# Patient Record
Sex: Female | Born: 1990 | Race: White | Hispanic: No | Marital: Single | Smoking: Never smoker
Health system: Southern US, Community
[De-identification: ages and names within clinical notes are randomized; demographics above are authoritative.]

## PROBLEM LIST (undated history)

## (undated) DIAGNOSIS — Z789 Other specified health status: Secondary | ICD-10-CM

## (undated) DIAGNOSIS — D649 Anemia, unspecified: Secondary | ICD-10-CM

## (undated) HISTORY — PX: MYRINGOTOMY: SUR874

---
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
## 2010-01-28 ENCOUNTER — Emergency Department: Payer: Self-pay | Admitting: Emergency Medicine

## 2012-12-10 ENCOUNTER — Emergency Department: Payer: Self-pay | Admitting: Emergency Medicine

## 2013-05-30 ENCOUNTER — Emergency Department: Payer: Self-pay | Admitting: Emergency Medicine

## 2013-10-18 ENCOUNTER — Emergency Department: Payer: Self-pay | Admitting: Emergency Medicine

## 2014-02-26 ENCOUNTER — Emergency Department: Payer: Self-pay | Admitting: Emergency Medicine

## 2014-04-03 LAB — OB RESULTS CONSOLE VARICELLA ZOSTER ANTIBODY, IGG: Varicella: IMMUNE

## 2014-04-03 LAB — OB RESULTS CONSOLE ABO/RH: RH Type: POSITIVE

## 2014-04-03 LAB — OB RESULTS CONSOLE GC/CHLAMYDIA
Chlamydia: NEGATIVE
Gonorrhea: NEGATIVE

## 2014-04-03 LAB — OB RESULTS CONSOLE ANTIBODY SCREEN: Antibody Screen: NEGATIVE

## 2014-04-03 LAB — OB RESULTS CONSOLE HEPATITIS B SURFACE ANTIGEN: HEP B S AG: NEGATIVE

## 2014-04-03 LAB — OB RESULTS CONSOLE RUBELLA ANTIBODY, IGM: Rubella: IMMUNE

## 2014-04-03 LAB — OB RESULTS CONSOLE RPR: RPR: NONREACTIVE

## 2014-04-03 LAB — OB RESULTS CONSOLE HIV ANTIBODY (ROUTINE TESTING): HIV: NONREACTIVE

## 2014-04-09 ENCOUNTER — Encounter: Payer: Self-pay | Admitting: Obstetrics & Gynecology

## 2014-05-21 ENCOUNTER — Encounter: Payer: Self-pay | Admitting: Obstetrics and Gynecology

## 2014-05-22 NOTE — L&D Delivery Note (Signed)
Delivery Note At 8:22 AM a viable female was delivered via C-Section, Low Transverse (Presentation: cephalic ).  APGAR: 9, 10 ; weight 7 lb 9 oz .   Placenta status: , .  Cord: 3 vessels with the following complications: None.    Anesthesia: Spinal  Episiotomy:   Lacerations:   Suture Repair: n/a Est. Blood Loss (mL):  900 mL  Mom to postpartum.  Baby to Couplet care / Skin to Skin.  Conard NovakStephen D. Jackson, MD, FACOG 10/15/2014 9:31 AM

## 2014-07-30 ENCOUNTER — Encounter: Payer: Self-pay | Admitting: Obstetrics & Gynecology

## 2014-08-30 ENCOUNTER — Observation Stay
Admit: 2014-08-30 | Disposition: A | Discharge status: Home / Self Care | Payer: Self-pay | Attending: Obstetrics and Gynecology | Admitting: Obstetrics and Gynecology

## 2014-08-30 LAB — URINALYSIS, COMPLETE
BLOOD: NEGATIVE
Bilirubin,UR: NEGATIVE
Glucose,UR: NEGATIVE mg/dL (ref 0–75)
KETONE: NEGATIVE
Nitrite: NEGATIVE
PROTEIN: NEGATIVE
Ph: 7 (ref 4.5–8.0)
SPECIFIC GRAVITY: 1.013 (ref 1.003–1.030)

## 2014-09-02 ENCOUNTER — Observation Stay
Admit: 2014-09-02 | Disposition: A | Discharge status: Home / Self Care | Payer: Self-pay | Attending: Certified Nurse Midwife | Admitting: Certified Nurse Midwife

## 2014-09-10 ENCOUNTER — Encounter
Admit: 2014-09-10 | Disposition: A | Discharge status: Home / Self Care | Payer: Self-pay | Attending: Maternal & Fetal Medicine | Admitting: Maternal & Fetal Medicine

## 2014-09-23 ENCOUNTER — Observation Stay
Admission: RE | Admit: 2014-09-23 | Discharge: 2014-09-23 | Disposition: A | Discharge status: Home / Self Care | Payer: Medicaid Other | Attending: Obstetrics and Gynecology | Admitting: Obstetrics and Gynecology

## 2014-09-23 DIAGNOSIS — Z3490 Encounter for supervision of normal pregnancy, unspecified, unspecified trimester: Secondary | ICD-10-CM | POA: Diagnosis not present

## 2014-09-23 DIAGNOSIS — Z3689 Encounter for other specified antenatal screening: Secondary | ICD-10-CM

## 2014-09-23 HISTORY — DX: Other specified health status: Z78.9

## 2014-09-27 ENCOUNTER — Telehealth: Payer: Self-pay

## 2014-09-27 ENCOUNTER — Telehealth: Payer: Self-pay | Admitting: *Deleted

## 2014-09-27 NOTE — Telephone Encounter (Signed)
Labor precautions given, patient advised to call OB on call with emergent concerns or visit emergency department. Patient denies leaking of fluid, vaginal bleeding and/or contractions and pain.

## 2014-09-27 NOTE — Telephone Encounter (Signed)
Spoke to patient's mother on phone.  She stated her daughter was concerned because she had been having Braxton Hicks contractions for 5 days and she is at work today at [employer].  States that she was leaking mucus stuff today and this concerned her.  Discussed with patient's mother that this could be her mucus plug, but that the patient can place a pad on and continue to assess for fluid leakage.  Recommended that if patient able to she should sit down for a little bit at work and drink a lot of water.  To continue to assess ctx pattern.  Discussed that if patient is concerned she can come into hospital for further assessment or if she notices any change from current status.

## 2014-09-29 NOTE — H&P (Signed)
L&D Evaluation:  History:  HPI 24 yo G2P1001 with LMP of 01/14/14  & EDD of 10/21/14 with pNC at [facility] here tonight for c/o "rt hip pain and lt lower lumbar pain", increased freq of urine. After eval, pt had [facility] on her chart but, had seen [facility] and scheduled for LTCS/BTL. Pt is uninformed of who her dr is. No ROM, VB, decreased FM or UC's. No fever, no aching, no chills, no other complaints. After eval, Dr Staebler was advised and agreed with plan of care.   Presents with back pain, Rt hip pain   Patient's Medical History Anemia, Depression, Obesity,   Patient's Surgical History C-section (failed vacuum at -1 station)   Medications Pre Natal Vitamins   Allergies Sulfa   Social History none   Family History Non-Contributory   ROS:  ROS All systems were reviewed.  HEENT, CNS, GI, GU, Respiratory, CV, Renal and Musculoskeletal systems were found to be normal.   Exam:  Vital Signs stable   General no apparent distress   Mental Status clear   Chest clear   Heart normal sinus rhythm, no murmur/gallop/rubs   Abdomen gravid, non-tender   Estimated Fetal Weight Average for gestational age   Back no CVAT   Edema 1+   Reflexes 1+   Clonus negative   Pelvic int os closed/ext os 1 cm   Mebranes Intact   FHT normal rate with no decels, +accels, NST reactive   Ucx absent   Skin dry   Lymph no lymphadenopathy   Other Pt enc to fu with [facility] as she is a [facility] pt and not a [facility] pt. Dr Staebler aware and met pt. Agreed with plan of care   Impression:  Impression IUP at 32 4/7 weeks with Rt hip pain and Lt lumbar pain   Plan:  Plan discharge, Amoxicillin 250 mg po tid, needs liq x 10 days   Comments Rt hip: no trauma, no ecchymosis, no edema. Lt lumbar back: Neg CVAT. no spasm   Electronic Signatures: Jones, Caron W (CNM)  (Signed 10-Apr-16 22:32)  Authored: L&D Evaluation   Last Updated: 10-Apr-16 22:32 by Jones, Caron W (CNM) 

## 2014-09-30 ENCOUNTER — Observation Stay
Admission: RE | Admit: 2014-09-30 | Discharge: 2014-09-30 | Disposition: A | Discharge status: Home / Self Care | Payer: Medicaid Other | Attending: Certified Nurse Midwife | Admitting: Certified Nurse Midwife

## 2014-09-30 DIAGNOSIS — Z3A37 37 weeks gestation of pregnancy: Secondary | ICD-10-CM | POA: Insufficient documentation

## 2014-09-30 DIAGNOSIS — E669 Obesity, unspecified: Secondary | ICD-10-CM | POA: Insufficient documentation

## 2014-09-30 DIAGNOSIS — Z3490 Encounter for supervision of normal pregnancy, unspecified, unspecified trimester: Secondary | ICD-10-CM

## 2014-09-30 DIAGNOSIS — O26893 Other specified pregnancy related conditions, third trimester: Secondary | ICD-10-CM | POA: Diagnosis not present

## 2014-09-30 NOTE — Discharge Instructions (Signed)
Keep scheduled appointment on 10/01/14

## 2014-09-30 NOTE — Progress Notes (Addendum)
24 year old G2 P1001 with EDC=10/21/2014 by 12 4/7 week ultrasound presents to L&D for a NST/AFI at [redacted] weeks gestation. Antenatal testing being done weekly due to obesity with BMI>42. PNC at [facility] also remarkable for prior Cesarean section and she has a CS scheduled in 2 weeks. Reports good FM. Growth scans have been "normal" per patient.   O: BP 122/65 mmHg  Pulse 92  Temp(Src) 98.7 F (37.1 C) (Oral)  Resp 16  FHR 135-140 with accels to 150s to 160 with moderate variability AFI; 12.8 cm. Cephalic Toco: no contractions seen  A: IUP at 37 weeks with reactive NST and normal AFI  P: DC home RTN in 1 week for NST/AFI FU at [facility] as scheduled. Daily FKCs  Phelicia Dantes, CNM

## 2014-09-30 NOTE — OB Triage Note (Signed)
Scheduled NST, completed

## 2014-10-07 ENCOUNTER — Encounter: Payer: Self-pay | Admitting: *Deleted

## 2014-10-07 ENCOUNTER — Observation Stay
Admission: RE | Admit: 2014-10-07 | Discharge: 2014-10-07 | Disposition: A | Discharge status: Home / Self Care | Payer: Medicaid Other | Source: Ambulatory Visit | Attending: Obstetrics & Gynecology | Admitting: Obstetrics & Gynecology

## 2014-10-07 DIAGNOSIS — Z3A38 38 weeks gestation of pregnancy: Secondary | ICD-10-CM | POA: Insufficient documentation

## 2014-10-07 DIAGNOSIS — O99213 Obesity complicating pregnancy, third trimester: Principal | ICD-10-CM | POA: Diagnosis present

## 2014-10-07 NOTE — OB Triage Note (Signed)
Scheduled NST for obesity.

## 2014-10-07 NOTE — Discharge Summary (Signed)
Patient discharged home, discharge instructions reviewed, pt states understanding. Pt has appointment at [facility] today and US tomorrow with C/S scheduled next week, pt left floor in stable condition and denies any other needs at this time

## 2014-10-07 NOTE — Progress Notes (Signed)
Pt is a 24 yo G2P1 at 3133w0d pt of the [facility] seen today for an NST for obesity. NST found to be reactive.  FHR 125, moderate variability, +accels, no decels.   Will continue with current plan for APT. C/S scheduled 10/15/14.

## 2014-10-07 NOTE — Discharge Instructions (Signed)
Drink plenty of fluid and get plenty of rest, call your provider for any other concerns and keep all scheduled appointments  Office visit today and US tomorrow. C/S scheduled next week

## 2014-10-14 ENCOUNTER — Encounter: Payer: Self-pay | Admitting: Obstetrics and Gynecology

## 2014-10-14 ENCOUNTER — Encounter
Admission: RE | Admit: 2014-10-14 | Discharge: 2014-10-14 | Disposition: A | Discharge status: Home / Self Care | Payer: Medicaid Other | Source: Ambulatory Visit | Attending: Obstetrics and Gynecology | Admitting: Obstetrics and Gynecology

## 2014-10-14 HISTORY — DX: Anemia, unspecified: D64.9

## 2014-10-14 LAB — CBC
HCT: 33.2 % — ABNORMAL LOW (ref 35.0–47.0)
HEMOGLOBIN: 10.6 g/dL — AB (ref 12.0–16.0)
MCH: 27.4 pg (ref 26.0–34.0)
MCHC: 32 g/dL (ref 32.0–36.0)
MCV: 85.6 fL (ref 80.0–100.0)
Platelets: 281 10*3/uL (ref 150–440)
RBC: 3.88 MIL/uL (ref 3.80–5.20)
RDW: 14.8 % — ABNORMAL HIGH (ref 11.5–14.5)
WBC: 14.3 10*3/uL — AB (ref 3.6–11.0)

## 2014-10-14 LAB — TYPE AND SCREEN
ABO/RH(D): O POS
Antibody Screen: NEGATIVE

## 2014-10-14 LAB — ABO/RH: ABO/RH(D): O POS

## 2014-10-15 ENCOUNTER — Encounter
Admission: RE | Disposition: A | Discharge status: Home / Self Care | Payer: Self-pay | Source: Ambulatory Visit | Attending: Obstetrics and Gynecology

## 2014-10-15 ENCOUNTER — Inpatient Hospital Stay: Payer: Medicaid Other | Admitting: Anesthesiology

## 2014-10-15 ENCOUNTER — Inpatient Hospital Stay
Admission: RE | Admit: 2014-10-15 | Payer: Medicaid Other | Source: Ambulatory Visit | Admitting: Obstetrics and Gynecology

## 2014-10-15 ENCOUNTER — Inpatient Hospital Stay
Admission: RE | Admit: 2014-10-15 | Discharge: 2014-10-17 | DRG: 765 | Disposition: A | Discharge status: Home / Self Care | Payer: Medicaid Other | Source: Ambulatory Visit | Attending: Obstetrics and Gynecology | Admitting: Obstetrics and Gynecology

## 2014-10-15 DIAGNOSIS — N736 Female pelvic peritoneal adhesions (postinfective): Secondary | ICD-10-CM | POA: Diagnosis present

## 2014-10-15 DIAGNOSIS — O34219 Maternal care for unspecified type scar from previous cesarean delivery: Secondary | ICD-10-CM

## 2014-10-15 DIAGNOSIS — Z302 Encounter for sterilization: Secondary | ICD-10-CM

## 2014-10-15 DIAGNOSIS — E669 Obesity, unspecified: Secondary | ICD-10-CM | POA: Diagnosis present

## 2014-10-15 DIAGNOSIS — Z3A39 39 weeks gestation of pregnancy: Secondary | ICD-10-CM | POA: Diagnosis present

## 2014-10-15 DIAGNOSIS — O99213 Obesity complicating pregnancy, third trimester: Secondary | ICD-10-CM | POA: Diagnosis present

## 2014-10-15 DIAGNOSIS — Z3490 Encounter for supervision of normal pregnancy, unspecified, unspecified trimester: Secondary | ICD-10-CM

## 2014-10-15 DIAGNOSIS — O3421 Maternal care for scar from previous cesarean delivery: Secondary | ICD-10-CM | POA: Diagnosis present

## 2014-10-15 DIAGNOSIS — Z882 Allergy status to sulfonamides status: Secondary | ICD-10-CM

## 2014-10-15 DIAGNOSIS — Z6841 Body Mass Index (BMI) 40.0 and over, adult: Secondary | ICD-10-CM

## 2014-10-15 DIAGNOSIS — Z3483 Encounter for supervision of other normal pregnancy, third trimester: Secondary | ICD-10-CM | POA: Diagnosis present

## 2014-10-15 DIAGNOSIS — O9989 Other specified diseases and conditions complicating pregnancy, childbirth and the puerperium: Secondary | ICD-10-CM | POA: Diagnosis present

## 2014-10-15 DIAGNOSIS — O99214 Obesity complicating childbirth: Secondary | ICD-10-CM | POA: Diagnosis present

## 2014-10-15 DIAGNOSIS — Z98891 History of uterine scar from previous surgery: Secondary | ICD-10-CM

## 2014-10-15 LAB — HIV ANTIBODY (ROUTINE TESTING W REFLEX): HIV SCREEN 4TH GENERATION: NONREACTIVE

## 2014-10-15 LAB — RPR: RPR Ser Ql: NONREACTIVE

## 2014-10-15 SURGERY — Surgical Case
Anesthesia: Spinal | Wound class: Clean Contaminated

## 2014-10-15 MED ORDER — BUPIVACAINE HCL 0.5 % IJ SOLN
5.0000 mL | Freq: Once | INTRAMUSCULAR | Status: DC
  Filled 2014-10-15: qty 5

## 2014-10-15 MED ORDER — BUPIVACAINE 0.25 % ON-Q PUMP DUAL CATH 400 ML: 400.0000 mL | INJECTION | Status: DC

## 2014-10-15 MED ORDER — HYDROMORPHONE HCL 1 MG/ML IJ SOLN: 0.2500 mg | INTRAMUSCULAR | Status: DC | PRN

## 2014-10-15 MED ORDER — CITRIC ACID-SODIUM CITRATE 334-500 MG/5ML PO SOLN
ORAL | Status: AC
  Administered 2014-10-15: 30 mL via ORAL
  Filled 2014-10-15: qty 15

## 2014-10-15 MED ORDER — IBUPROFEN 600 MG PO TABS
600.0000 mg | ORAL_TABLET | Freq: Four times a day (QID) | ORAL | Status: DC | PRN
  Filled 2014-10-15 (×2): qty 1

## 2014-10-15 MED ORDER — LACTATED RINGERS IV SOLN: Freq: Once | INTRAVENOUS | Status: DC

## 2014-10-15 MED ORDER — SODIUM CHLORIDE 0.9 % IJ SOLN: 3.0000 mL | INTRAMUSCULAR | Status: DC | PRN

## 2014-10-15 MED ORDER — CEFAZOLIN SODIUM-DEXTROSE 2-3 GM-% IV SOLR
INTRAVENOUS | Status: AC
  Administered 2014-10-15: 2 g via INTRAVENOUS
  Filled 2014-10-15: qty 50

## 2014-10-15 MED ORDER — BUPIVACAINE HCL (PF) 0.5 % IJ SOLN
INTRAMUSCULAR | Status: AC
  Filled 2014-10-15: qty 30

## 2014-10-15 MED ORDER — BUPIVACAINE IN DEXTROSE 0.75-8.25 % IT SOLN
INTRATHECAL | Status: DC | PRN
  Administered 2014-10-15: 1.5 mL via INTRATHECAL

## 2014-10-15 MED ORDER — NALBUPHINE HCL 10 MG/ML IJ SOLN: 5.0000 mg | INTRAMUSCULAR | Status: DC | PRN

## 2014-10-15 MED ORDER — DIBUCAINE 1 % RE OINT: 1.0000 "application " | TOPICAL_OINTMENT | RECTAL | Status: DC | PRN

## 2014-10-15 MED ORDER — DIPHENHYDRAMINE HCL 25 MG PO CAPS: 25.0000 mg | ORAL_CAPSULE | ORAL | Status: DC | PRN

## 2014-10-15 MED ORDER — WITCH HAZEL-GLYCERIN EX PADS: 1.0000 "application " | MEDICATED_PAD | CUTANEOUS | Status: DC | PRN

## 2014-10-15 MED ORDER — SCOPOLAMINE 1 MG/3DAYS TD PT72
1.0000 | MEDICATED_PATCH | Freq: Once | TRANSDERMAL | Status: DC
Start: 2014-10-15 — End: 2014-10-17
  Filled 2014-10-15: qty 1

## 2014-10-15 MED ORDER — SIMETHICONE 80 MG PO CHEW
80.0000 mg | CHEWABLE_TABLET | Freq: Three times a day (TID) | ORAL | Status: DC
  Administered 2014-10-15 – 2014-10-17 (×4): 80 mg via ORAL
  Filled 2014-10-15 (×5): qty 1

## 2014-10-15 MED ORDER — MENTHOL 3 MG MT LOZG
1.0000 | LOZENGE | OROMUCOSAL | Status: DC | PRN
  Filled 2014-10-15: qty 9

## 2014-10-15 MED ORDER — SENNOSIDES-DOCUSATE SODIUM 8.6-50 MG PO TABS: 2.0000 | ORAL_TABLET | ORAL | Status: DC

## 2014-10-15 MED ORDER — MORPHINE SULFATE (PF) 0.5 MG/ML IJ SOLN
INTRAMUSCULAR | Status: DC | PRN
  Administered 2014-10-15: .2 mg via INTRATHECAL

## 2014-10-15 MED ORDER — NALBUPHINE HCL 10 MG/ML IJ SOLN: 5.0000 mg | Freq: Once | INTRAMUSCULAR | Status: AC | PRN

## 2014-10-15 MED ORDER — CEFAZOLIN SODIUM-DEXTROSE 2-3 GM-% IV SOLR
2.0000 g | INTRAVENOUS | Status: AC
  Administered 2014-10-15: 2 g via INTRAVENOUS

## 2014-10-15 MED ORDER — DEXAMETHASONE SODIUM PHOSPHATE 10 MG/ML IJ SOLN
INTRAMUSCULAR | Status: DC | PRN
  Administered 2014-10-15: 10 mg via INTRAVENOUS

## 2014-10-15 MED ORDER — LANOLIN HYDROUS EX OINT: 1.0000 "application " | TOPICAL_OINTMENT | CUTANEOUS | Status: DC | PRN

## 2014-10-15 MED ORDER — LACTATED RINGERS IV SOLN
INTRAVENOUS | Status: DC
Start: 2014-10-15 — End: 2014-10-15
  Administered 2014-10-15 (×2): via INTRAVENOUS

## 2014-10-15 MED ORDER — OXYTOCIN 40 UNITS IN LACTATED RINGERS INFUSION - SIMPLE MED
INTRAVENOUS | Status: AC
  Administered 2014-10-15: 40 mL via INTRAVENOUS
  Filled 2014-10-15: qty 1000

## 2014-10-15 MED ORDER — DIPHENHYDRAMINE HCL 50 MG/ML IJ SOLN: 12.5000 mg | INTRAMUSCULAR | Status: DC | PRN

## 2014-10-15 MED ORDER — FERROUS SULFATE 325 (65 FE) MG PO TABS
325.0000 mg | ORAL_TABLET | Freq: Two times a day (BID) | ORAL | Status: DC
  Administered 2014-10-15: 325 mg via ORAL
  Filled 2014-10-15 (×2): qty 1

## 2014-10-15 MED ORDER — BUPIVACAINE HCL 0.5 % IJ SOLN
INTRAMUSCULAR | Status: DC | PRN
  Administered 2014-10-15: 10 mL

## 2014-10-15 MED ORDER — DIPHENHYDRAMINE HCL 25 MG PO CAPS: 25.0000 mg | ORAL_CAPSULE | Freq: Four times a day (QID) | ORAL | Status: DC | PRN

## 2014-10-15 MED ORDER — OXYTOCIN 40 UNITS IN LACTATED RINGERS INFUSION - SIMPLE MED
62.5000 mL/h | INTRAVENOUS | Status: AC
  Administered 2014-10-15: 40 mL via INTRAVENOUS
  Filled 2014-10-15 (×2): qty 1000

## 2014-10-15 MED ORDER — NALOXONE HCL 0.4 MG/ML IJ SOLN: 0.4000 mg | INTRAMUSCULAR | Status: DC | PRN

## 2014-10-15 MED ORDER — CITRIC ACID-SODIUM CITRATE 334-500 MG/5ML PO SOLN
30.0000 mL | Freq: Once | ORAL | Status: AC
  Administered 2014-10-15: 30 mL via ORAL

## 2014-10-15 MED ORDER — LACTATED RINGERS IV SOLN: INTRAVENOUS | Status: DC

## 2014-10-15 MED ORDER — BUPIVACAINE ON-Q PAIN PUMP (FOR ORDER SET NO CHG)
INJECTION | Status: DC
  Filled 2014-10-15: qty 1

## 2014-10-15 MED ORDER — MEPERIDINE HCL 25 MG/ML IJ SOLN: 6.2500 mg | INTRAMUSCULAR | Status: DC | PRN

## 2014-10-15 MED ORDER — DEXTROSE 5 % IV SOLN
1.0000 ug/kg/h | INTRAVENOUS | Status: DC | PRN
  Filled 2014-10-15: qty 2

## 2014-10-15 MED ORDER — BUPIVACAINE 0.25 % ON-Q PUMP DUAL CATH 400 ML
INJECTION | Status: AC
  Filled 2014-10-15: qty 400

## 2014-10-15 MED ORDER — PRENATAL MULTIVITAMIN CH
1.0000 | ORAL_TABLET | Freq: Every day | ORAL | Status: DC
  Filled 2014-10-15 (×2): qty 1

## 2014-10-15 MED ORDER — ONDANSETRON HCL 4 MG/2ML IJ SOLN: 4.0000 mg | Freq: Three times a day (TID) | INTRAMUSCULAR | Status: DC | PRN

## 2014-10-15 MED ORDER — ONDANSETRON HCL 4 MG/2ML IJ SOLN
INTRAMUSCULAR | Status: DC | PRN
  Administered 2014-10-15: 4 mg via INTRAVENOUS

## 2014-10-15 SURGICAL SUPPLY — 32 items
CANISTER SUCT 3000ML (MISCELLANEOUS) ×3 IMPLANT
CATH KIT ON-Q SILVERSOAK 5IN (CATHETERS) ×6 IMPLANT
CLOSURE WOUND 1/2 X4 (GAUZE/BANDAGES/DRESSINGS) ×1
DRSG TEGADERM 4X4.75 (GAUZE/BANDAGES/DRESSINGS) ×3 IMPLANT
DRSG TELFA 3X8 NADH (GAUZE/BANDAGES/DRESSINGS) ×3 IMPLANT
ELECT CAUTERY BLADE 6.4 (BLADE) ×3 IMPLANT
GAUZE SPONGE 4X4 12PLY STRL (GAUZE/BANDAGES/DRESSINGS) ×3 IMPLANT
GLOVE BIO SURGEON STRL SZ7 (GLOVE) ×3 IMPLANT
GLOVE INDICATOR 7.5 STRL GRN (GLOVE) ×3 IMPLANT
GOWN STRL REUS W/ TWL LRG LVL3 (GOWN DISPOSABLE) ×3 IMPLANT
GOWN STRL REUS W/TWL LRG LVL3 (GOWN DISPOSABLE) ×6
LIQUID BAND (GAUZE/BANDAGES/DRESSINGS) ×6 IMPLANT
NS IRRIG 1000ML POUR BTL (IV SOLUTION) ×3 IMPLANT
PACK C SECTION AR (MISCELLANEOUS) ×3 IMPLANT
PAD GROUND ADULT SPLIT (MISCELLANEOUS) ×3 IMPLANT
PAD OB MATERNITY 4.3X12.25 (PERSONAL CARE ITEMS) ×6 IMPLANT
PAD PREP 24X41 OB/GYN DISP (PERSONAL CARE ITEMS) ×3 IMPLANT
SPONGE LAP 18X18 5 PK (GAUZE/BANDAGES/DRESSINGS) ×6 IMPLANT
STRIP CLOSURE SKIN 1/2X4 (GAUZE/BANDAGES/DRESSINGS) ×2 IMPLANT
SUT CHROMIC GUT BROWN 0 54 (SUTURE) ×1 IMPLANT
SUT CHROMIC GUT BROWN 0 54IN (SUTURE) ×3
SUT MNCRL 4-0 (SUTURE) ×2
SUT MNCRL 4-0 27XMFL (SUTURE) ×1
SUT PDS AB 1 TP1 96 (SUTURE) ×3 IMPLANT
SUT PLAIN 2 0 XLH (SUTURE) ×3 IMPLANT
SUT PLAIN GUT 0 (SUTURE) ×6 IMPLANT
SUT PLAIN GUT 2-0 30 C14 SG823 (SUTURE) ×3
SUT VIC AB 0 CT1 36 (SUTURE) ×12 IMPLANT
SUT VICRYL 3-0 CR8 SH (SUTURE) ×3 IMPLANT
SUTURE MNCRL 4-0 27XMF (SUTURE) ×1 IMPLANT
SUTURE PLN GUT2-0 30 C14 SG823 (SUTURE) ×1 IMPLANT
SWABSTK COMLB BENZOIN TINCTURE (MISCELLANEOUS) ×3 IMPLANT

## 2014-10-15 NOTE — Transfer of Care (Signed)
Immediate Anesthesia Transfer of Care Note  Patient: Carolyn Davis  Procedure(s) Performed: Procedure(s): CESAREAN SECTION, Bilateral Tubal Ligation (N/A)  Patient Location: PACU and Women's Unit  Anesthesia Type:Spinal  Level of Consciousness: awake, alert  and oriented  Airway & Oxygen Therapy: Patient Spontanous Breathing and Patient connected to face mask oxygen  Post-op Assessment: Report given to RN and Post -op Vital signs reviewed and stable  Post vital signs: Reviewed and stable  Last Vitals:  Filed Vitals:   10/15/14 0540  Temp: 36.6 C  Resp: 22    Complications: No apparent anesthesia complications

## 2014-10-15 NOTE — Progress Notes (Signed)
Pt refuses to take pills.

## 2014-10-15 NOTE — H&P (Signed)
History and Physical Interval Note:  Carolyn KinsJennifer D Davis  has presented today for surgery, with the diagnosis of pregnancy  The various methods of treatment have been discussed with the patient and family. After consideration of risks, benefits and other options for treatment, the patient has consented to  Procedure(s): CESAREAN SECTION (N/A) and bilateral tubal ligation as a surgical intervention .  The patient's history has been reviewed, patient examined, no change in status, stable for surgery.  I have reviewed the patient's chart and labs.  Questions were answered to the patient's satisfaction.  I have discussed in great detail the risks/benefits/alternatives to permanent sterilization.  She voices understanding and prior consideration of these and still strongly desires a permanent form of sterilization.  The patient is not taking a beta blocker and none is indicated for this procedure.  Conard NovakStephen D. Jackson, MD, [facility] 10/15/2014 7:36 AM

## 2014-10-15 NOTE — Lactation Note (Signed)
This note was copied from the chart of Carolyn Davis. Lactation Consultation Note  Patient Name: Carolyn Davis [id number]'s Date: 10/15/2014 Reason for consult: Follow-up assessment   Maternal Data Does the patient have breastfeeding experience prior to this delivery?: Yes  Feeding Feeding Type: Breast Fed  LATCH Score/Interventions Latch: Grasps breast easily, tongue down, lips flanged, rhythmical sucking.  Audible Swallowing: Spontaneous and intermittent  Type of Nipple: Everted at rest and after stimulation  Comfort (Breast/Nipple): Soft / non-tender     Hold (Positioning): No assistance needed to correctly position infant at breast.  LATCH Score: 5510  Mother is not that enthusiastic about breast feeding and may need additional support.    Consult Status Consult Status: Follow-up    Trudee GripCarolyn P Garland Smouse 10/15/2014, 3:36 PM

## 2014-10-15 NOTE — Op Note (Signed)
Cesarean Section Procedure Note   Patient:Carolyn Davis  MRN: [id number]   Date of surgery: 10/15/2014   Pre-operative Diagnosis: 1) Intrauterine Pregnancy at [redacted]w[redacted]d, 2) History of prior cesarean delivery, desires repeat, 3) desires permanent sterilization  Post-operative Diagnosis: 1) Intrauterine Pregnancy at [redacted]w[redacted]d, 2) History of prior cesarean delivery, desires repeat, 3) desires permanent sterilization  Procedures:  1) Repeat low transverse cesarean section via pfannenstiel incsion 2) lysis of adhesions 3) bilateral tubal ligation via Pomeroy method  Surgeon: Surgeon(s) and Role:    * Conard Novak, MD - Primary    * Holland Bing, MD - Assisting   Anesthesia: spinal   Findings:  1) normal appearing gravid uterus, fallopian tubes, and ovaries 2) viable female infant with APGARS of 9 at 1 minute and 10 at 5 minutes 3) Dense adhesions of anterior abdominal wall to anterior uterus   Estimated Blood Loss: 900 ml  Total IV Fluids: 1,600 ml   Urine Output: 150 mL  Specimens: Portion of right and left fallopian tubes for permanent  Complications: no complications  Disposition: PACU - hemodynamically stable.   Maternal Condition: stable   Baby condition / location:  Couplet care / Skin to Skin  Procedure Details:  The patient was seen in the Holding Room. The risks, benefits, complications, treatment options, and expected outcomes were discussed with the patient. The patient concurred with the proposed plan, giving informed consent. identified as Carolyn Davis and the procedure verified as C-Section Delivery. A Time Out was held and the above information confirmed.   After induction of anesthesia, the patient was draped and prepped in the usual sterile manner. A Pfannenstiel incision was made and carried down through the subcutaneous tissue to the fascia. Fascial incision was made and extended transversely. The fascia was separated from the underlying rectus tissue  superiorly and inferiorly. The peritoneum was identified and entered. Peritoneal incision was extended longitudinally. Dense adhesions of the uterus were noted to the anterior abdominal wall. The adhesions were taken down without difficulty. The utero-vesical peritoneal reflection was incised transversely and the bladder flap was bluntly freed from the lower uterine segment. A low transverse uterine incision was made and the hysterotomy was extended with cranial-caudal tension. Delivered from cephalic presentation was a 7 lb 9 oz living newborn infant(s) with Apgar scores of 9 at one minute and 10 at five abdomen minutes. Cord ph was not sent the umbilical cord was clamped and cut cord blood was obtained for evaluation. The placenta was removed Intact and appeared normal. The uterine outline, tubes and ovaries appeared normal}. The uterine incision was closed with running locked sutures of 0 Vicryl.  A second layer of the same suture was thrown in an imbricating fashion.    Attention was turned to the left fallopian tube where the mid isthmic region was grasped with a Babcock clamp. Tubal ligation was performed via the Pomeroy method and a 3 cm segment was removed without difficulty. Hemostasis was noted. The same procedure was carried out on the right fallopian tube. Note that each tube was followed to the fimbriated end to ensure that the correct structure was ligated.  The peritoneum was reapproximated using 0 Vicryl in a running fashion. The rectus muscle bellies were inspected and found to be hemostatic.  The On-Q catheter pumps were inserted in accordance with the manufacturer's recommendations.  The catheters were inserted approximately 4cm cephelad to the incision line, approximately 1cm apart, straddling the midline.  They were inserted to a depth  of the 4th mark. They were positioned superficial to the rectus abdominus muscles and deep to the rectus fascia.    The fascia was then reapproximated  with running sutures of 1-0 PDS, looped. The subcutaneous tissue was reapproximated using 2-0 plain gut such that no greater than 2 cm of dead space remained. The skin closure was performed using staples.   The On-Q catheters were bolused with 5 mL of 0.5% marcaine plain for a total of 10 mL.  The catheters were affixed to the skin with surgical skin glue, steri-strips, and tegaderm.    Instrument, sponge, and needle counts were correct prior the abdominal closure and were correct at the conclusion of the case.  The patient received Ancef 2 gram IV prior to skin incision (within 60 minutes). For VTE prophylaxis she was wearing SCDs throughout the case.   Signed: Conard NovakStephen D. Kynadee Dam, MD, FACOG 10/15/2014 9:27 AM

## 2014-10-15 NOTE — Plan of Care (Signed)
Problem: Consults Goal: Birthing Suites Patient Information Press F2 to bring up selections list   Pt 37-[redacted] weeks EGA     

## 2014-10-15 NOTE — Op Note (Signed)
FHR via doppler  byy SDJ at [phone]

## 2014-10-15 NOTE — Anesthesia Procedure Notes (Signed)
Spinal Patient location during procedure: OR Start time: 10/15/2014 7:49 AM End time: 10/15/2014 7:56 AM Preanesthetic Checklist Completed: patient identified, site marked, surgical consent, pre-op evaluation, timeout performed, IV checked, risks and benefits discussed and monitors and equipment checked Spinal Block Patient position: sitting Prep: Betadine Patient monitoring: heart rate, cardiac monitor, continuous pulse ox and blood pressure Approach: midline Location: L3-4 Injection technique: single-shot Needle Needle type: Whitacre  Needle gauge: 25 G Needle length: 9 cm Needle insertion depth: 7 cm Assessment Sensory level: T6

## 2014-10-15 NOTE — Discharge Summary (Signed)
Obstetrical Discharge Summary  Date of Admission: 10/15/2014 Date of Discharge: 10/17/2014  Primary OB: [facility]  Gestational Age at Delivery: 3971w1d   Antepartum complications: obesity, history of prior cesarean delivery Reason for Admission: scheduled repeat cesarean delivery, bilateral tubal ligation Date of Delivery: 10/17/2014   Delivered By: Edison PaceS JACKSON, MD Delivery Type: repeat cesarean section, low transverse incision Intrapartum complications/course: None Anesthesia: spinal Placenta: spontaneous Laceration: n/a  Episiotomy: none Baby: Liveborn female, APGARs9/10, weight 7 lb 9oz.   Post partum course: Routine Disposition: Home  Rh Immune globulin given: not applicable Rubella vaccine given: not applicable Tdap vaccine given in AP or PP setting: not applicable Flu vaccine given in AP or PP setting: not applicable  Contraception: bilateral tubal ligation  Prenatal/Postnatal Panel: O POS//Rubella Immune//RPR negative//HIV negative/HepB Surface Ag negative//pap no abnormalities (date:  )//plans to breastfeed  Plan:  Carolyn Davis was discharged to home in good condition. [facility]  Discharge Medications:   Medication List    STOP taking these medications        ferrous sulfate 325 (65 FE) MG tablet      TAKE these medications        multivitamin Liqd  Take 5 mLs by mouth daily.     oxyCODONE 5 MG/5ML solution  Commonly known as:  ROXICODONE  Take 5-10 mLs (5-10 mg total) by mouth every 4 (four) hours as needed (pain 5mg  for 4-6and 10mg  for 7-10).

## 2014-10-15 NOTE — Anesthesia Preprocedure Evaluation (Signed)
Anesthesia Evaluation  Patient identified by MRN, date of birth, ID band  Reviewed: Allergy & Precautions, NPO status , Patient's Chart, lab work & pertinent test results  Airway Mallampati: II  TM Distance: >3 FB Neck ROM: Full    Dental  (+) Teeth Intact   Pulmonary          Cardiovascular     Neuro/Psych    GI/Hepatic   Endo/Other    Renal/GU      Musculoskeletal   Abdominal   Peds  Hematology  (+) anemia ,   Anesthesia Other Findings   Reproductive/Obstetrics (+) Pregnancy                             Anesthesia Physical Anesthesia Plan  ASA: II  Anesthesia Plan: Spinal   Post-op Pain Management:    Induction:   Airway Management Planned:   Additional Equipment:   Intra-op Plan:   Post-operative Plan:   Informed Consent: I have reviewed the patients History and Physical, chart, labs and discussed the procedure including the risks, benefits and alternatives for the proposed anesthesia with the patient or authorized representative who has indicated his/her understanding and acceptance.     Plan Discussed with:   Anesthesia Plan Comments:         Anesthesia Quick Evaluation

## 2014-10-16 LAB — CBC
HCT: 26.9 % — ABNORMAL LOW (ref 35.0–47.0)
HEMOGLOBIN: 8.6 g/dL — AB (ref 12.0–16.0)
MCH: 27.4 pg (ref 26.0–34.0)
MCHC: 32.1 g/dL (ref 32.0–36.0)
MCV: 85.3 fL (ref 80.0–100.0)
Platelets: 245 10*3/uL (ref 150–440)
RBC: 3.15 MIL/uL — AB (ref 3.80–5.20)
RDW: 14.7 % — ABNORMAL HIGH (ref 11.5–14.5)
WBC: 16.6 10*3/uL — ABNORMAL HIGH (ref 3.6–11.0)

## 2014-10-16 LAB — CHLAMYDIA/NGC RT PCR (ARMC ONLY)
Chlamydia Tr: NOT DETECTED
N GONORRHOEAE: NOT DETECTED

## 2014-10-16 LAB — SURGICAL PATHOLOGY

## 2014-10-16 MED ORDER — ACETAMINOPHEN 160 MG/5ML PO SOLN
650.0000 mg | Freq: Four times a day (QID) | ORAL | Status: DC | PRN
  Filled 2014-10-16: qty 20.3

## 2014-10-16 MED ORDER — DOCUSATE SODIUM 50 MG/5ML PO LIQD
100.0000 mg | Freq: Two times a day (BID) | ORAL | Status: DC
  Administered 2014-10-16: 100 mg via ORAL
  Filled 2014-10-16 (×4): qty 10

## 2014-10-16 MED ORDER — ADULT MULTIVITAMIN LIQUID CH
5.0000 mL | Freq: Every day | ORAL | Status: DC
  Administered 2014-10-16 – 2014-10-17 (×2): 5 mL via ORAL
  Filled 2014-10-16 (×2): qty 5

## 2014-10-16 MED ORDER — FERROUS SULFATE 220 (44 FE) MG/5ML PO ELIX
220.0000 mg | ORAL_SOLUTION | Freq: Two times a day (BID) | ORAL | Status: DC
  Administered 2014-10-16 – 2014-10-17 (×3): 220 mg via ORAL
  Filled 2014-10-16 (×5): qty 5

## 2014-10-16 MED ORDER — IBUPROFEN 100 MG/5ML PO SUSP
600.0000 mg | Freq: Four times a day (QID) | ORAL | Status: DC
  Administered 2014-10-16 – 2014-10-17 (×5): 600 mg via ORAL
  Filled 2014-10-16 (×11): qty 30

## 2014-10-16 MED ORDER — OXYCODONE HCL 5 MG/5ML PO SOLN
5.0000 mg | ORAL | Status: DC | PRN
Start: 2014-10-16 — End: 2014-10-17

## 2014-10-16 NOTE — Anesthesia Post-op Follow-up Note (Signed)
  Anesthesia Pain Follow-up Note  Patient: Miki KinsJennifer D Davoli  Day #: 1  Date of Follow-up: 10/16/2014 Time: 7:51 AM  Last Vitals:  Filed Vitals:   10/16/14 0501  BP:   Pulse:   Temp: 36.9 C  Resp:     Level of Consciousness: alert  Pain: none   Side Effects:None  Catheter Site Exam:clean, dry, no drainage  Plan: Catheter removed/tip intact  COOK-MARTIN,Catherina Pates

## 2014-10-16 NOTE — Progress Notes (Signed)
VSS. SCD's in place. IV infusing well per protocol. Q-pump in place. Fundus firm, rubra moderate. Dressing dry and intact. Foley patent and draining clear, yellow urine. Siq other at bedside..Marland Kitchen

## 2014-10-16 NOTE — Anesthesia Postprocedure Evaluation (Signed)
  Anesthesia Post-op Note  Patient: Carolyn Davis  Procedure(s) Performed: Procedure(s): CESAREAN SECTION, Bilateral Tubal Ligation (N/A)  Anesthesia type:Spinal  Patient location: PACU  Post pain: Pain level controlled  Post assessment: Post-op Vital signs reviewed, Patient's Cardiovascular Status Stable, Respiratory Function Stable, Patent Airway and No signs of Nausea or vomiting  Post vital signs: Reviewed and stable  Last Vitals:  Filed Vitals:   10/16/14 0501  BP:   Pulse:   Temp: 36.9 C  Resp:     Level of consciousness: awake, alert  and patient cooperative  Complications: No apparent anesthesia complications

## 2014-10-16 NOTE — Progress Notes (Signed)
Subjective: Postpartum Day 1: Cesarean Delivery with a BTL Patient reports no problems voiding.  Reports that her pain is not very well controlled since she does not take pills. Her bleeding is under control.   Objective: Vital signs in last 24 hours: Temp:  [97.6 F (36.4 C)-98.7 F (37.1 C)] 97.9 F (36.6 C) (05/27 0841) Pulse Rate:  [63-86] 86 (05/27 0841) Resp:  [18-20] 18 (05/27 0841) BP: (99-130)/(41-71) 116/67 mmHg (05/27 0841) SpO2:  [97 %-100 %] 98 % (05/27 0841) Weight:  [110.678 kg (244 lb)] 110.678 kg (244 lb) (05/26 1700)  Physical Exam:  General: alert, cooperative and no distress Lochia: appropriate Uterine Fundus: firm Incision: OR dressing in place. C/D/I DVT Evaluation: No evidence of DVT seen on physical exam.   Recent Labs  10/14/14 1302 10/16/14 0439  HGB 10.6* 8.6*  HCT 33.2* 26.9*    Assessment/Plan: Status post Cesarean section. Doing well postoperatively.  Continue current care. Will change all PO meds to liquid as pt does not take pills.  Continue working on pumping/breastfeeding Change OR dressing DC IVF.   Jannet MantisSubudhi,  Jacoba Cherney 10/16/2014, 10:17 AM

## 2014-10-17 MED ORDER — ADULT MULTIVITAMIN LIQUID CH: 5.0000 mL | Freq: Every day | ORAL | Status: DC

## 2014-10-17 MED ORDER — OXYCODONE HCL 5 MG/5ML PO SOLN: 5.0000 mg | ORAL | Status: DC | PRN

## 2014-10-17 NOTE — Discharge Instructions (Signed)

## 2014-10-17 NOTE — Progress Notes (Signed)
Discharge order in by Dr. Tiburcio PeaHarris. Instructions reviewed with patient. Pt v/u of all instructions. Prescription given to pt. ID bands of mom and infant matched. Escorted by nursing via w/c in stable condition, infant in arms.

## 2016-10-08 ENCOUNTER — Encounter: Payer: Self-pay | Admitting: Emergency Medicine

## 2016-10-08 ENCOUNTER — Emergency Department
Admission: EM | Admit: 2016-10-08 | Discharge: 2016-10-08 | Disposition: A | Discharge status: Home / Self Care | Payer: Medicaid Other | Attending: Emergency Medicine | Admitting: Emergency Medicine

## 2016-10-08 DIAGNOSIS — G5601 Carpal tunnel syndrome, right upper limb: Secondary | ICD-10-CM | POA: Diagnosis not present

## 2016-10-08 DIAGNOSIS — R202 Paresthesia of skin: Secondary | ICD-10-CM | POA: Diagnosis present

## 2016-10-08 DIAGNOSIS — G5600 Carpal tunnel syndrome, unspecified upper limb: Secondary | ICD-10-CM

## 2016-10-08 MED ORDER — NAPROXEN 375 MG PO TABS: 375.0000 mg | ORAL_TABLET | Freq: Two times a day (BID) | ORAL | 0 refills | Status: DC

## 2016-10-08 NOTE — ED Triage Notes (Signed)
Patient reports right hand with numbness for the past 2 months.  Reports started having sharpe pains in right hand yesterday.  Denies any type of injury.

## 2016-10-08 NOTE — ED Provider Notes (Signed)
[facility] Emergency Department Provider Note  ____________________________________________   I have reviewed the triage vital signs and the nursing notes.   HISTORY  Chief Complaint Hand Pain    HPI Carolyn Davis is a 26 y.o. female  Who presents today complaining of tingling in the first second, third, and radial aspect of the fourth digit for several months. Sometimes there is pain there. No weakness. Not numb.  No trauma. No fever or rash. She is right hand dominant and works at [employer].  She does repetitive motions.  Rest makes better,  Moving it makes worse.  No fever no other neuro complaints no chest pain.      Past Medical History:  Diagnosis Date  . Anemia   . Medical history non-contributory     Patient Active Problem List   Diagnosis Date Noted  . Previous cesarean delivery affecting pregnancy, antepartum 10/15/2014  . Delivered by cesarean section 10/15/2014  . S/P cesarean section 10/15/2014  . Obesity affecting pregnancy in third trimester 10/07/2014  . Pregnant and not yet delivered 09/30/2014  . NST (non-stress test) reactive 09/23/2014    Past Surgical History:  Procedure Laterality Date  . CESAREAN SECTION    . CESAREAN SECTION N/A 10/15/2014   Procedure: CESAREAN SECTION, Bilateral Tubal Ligation;  Surgeon: Conard Novak, MD;  Location: [facility] ORS;  Service: Obstetrics;  Laterality: N/A;  . MYRINGOTOMY      Prior to Admission medications   Medication Sig Start Date End Date Taking? Authorizing Provider  Multiple Vitamin (MULTIVITAMIN) LIQD Take 5 mLs by mouth daily. 10/17/14   Nadara Mustard, MD  oxyCODONE (ROXICODONE) 5 MG/5ML solution Take 5-10 mLs (5-10 mg total) by mouth every 4 (four) hours as needed (pain 5mg  for 4-6and 10mg  for 7-10). 10/17/14   Nadara Mustard, MD    Allergies Sulfa antibiotics  No family history on file.  Social History Social History  Substance Use Topics  . Smoking status: Never  Smoker  . Smokeless tobacco: Never Used  . Alcohol use No    Review of Systems See hpi otw negative.   ____________________________________________   PHYSICAL EXAM:  VITAL SIGNS: ED Triage Vitals  Enc Vitals Group     BP 10/08/16 0414 127/65     Pulse Rate 10/08/16 0414 (!) 51     Resp 10/08/16 0414 20     Temp 10/08/16 0414 98.1 F (36.7 C)     Temp Source 10/08/16 0414 Oral     SpO2 10/08/16 0414 97 %     Weight 10/08/16 0415 240 lb (108.9 kg)     Height 10/08/16 0415 5\' 1"  (1.549 m)     Head Circumference --      Peak Flow --      Pain Score 10/08/16 0411 5     Pain Loc --      Pain Edu? --      Excl. in GC? --     Constitutional: Alert and oriented. Well appearing and in no acute distress. Neck:  Nontender with no meningismus Cardiovascular: Normal rate, regular rhythm. Grossly normal heart sounds.  Good peripheral circulation. Back:  There is no focal tenderness or step off.  there is no midline tenderness there are no lesions noted. there is no CVA tenderness Musculoskeletal: No lower extremity tenderness, no upper extremity tenderness. No joint effusions, no DVT signs strong distal pulses no edema Neurologic:  Normal speech and language. No gross focal neurologic deficits are appreciated.  There is normal and symmetric two point discrimination in all fingers of both hands. + tinel test + phalen test Skin:  Skin is warm, dry and intact. No rash noted. Psychiatric: Mood and affect are normal. Speech and behavior are normal.  ____________________________________________   LABS (all labs ordered are listed, but only abnormal results are displayed)  Labs Reviewed - No data to display ____________________________________________  EKG   ____________________________________________  RADIOLOGY  I reviewed any imaging ordered by me or triage that were performed during my shift and, if possible, patient and/or family made aware of any abnormal  findings. ____________________________________________   PROCEDURES  Procedure(s) performed: None  Procedures  Critical Care performed: None  ____________________________________________   INITIAL IMPRESSION / ASSESSMENT AND PLAN / ED COURSE  Pertinent labs & imaging results that were available during my care of the patient were reviewed by me and considered in my medical decision making (see chart for details).  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 We will treat her with conservative measures first splint, NSAIDs advised rest, and we'll refer outpatient follow-up to surgery, orthopedic. Nothing at this time to suggest that this is referred cardiovascular pain nor is there anything suggest that there is any vascular compromise nor is there anything to suggest a compartment syndrome nor does anything to suggest a radiculopathy from the cervical region, very focal symptoms in the exact estimation of the dinner which is reproducible by Tinel test. Return precautions and follow-up given and understood    ____________________________________________   FINAL CLINICAL IMPRESSION(S) / ED DIAGNOSES  Final diagnoses:  None      This chart was dictated using voice recognition software.  Despite best efforts to proofread,  errors can occur which can change meaning.      Jeanmarie PlantMcShane, Levin Dagostino A, MD 10/08/16 [phone]

## 2016-10-22 ENCOUNTER — Emergency Department
Admission: EM | Admit: 2016-10-22 | Discharge: 2016-10-23 | Disposition: A | Discharge status: Home / Self Care | Payer: Medicaid Other | Attending: Student in an Organized Health Care Education/Training Program | Admitting: Student in an Organized Health Care Education/Training Program

## 2016-10-22 DIAGNOSIS — K0889 Other specified disorders of teeth and supporting structures: Secondary | ICD-10-CM | POA: Diagnosis present

## 2016-10-22 DIAGNOSIS — K047 Periapical abscess without sinus: Secondary | ICD-10-CM | POA: Insufficient documentation

## 2016-10-22 MED ORDER — NAPROXEN 125 MG/5ML PO SUSP: 500.0000 mg | Freq: Two times a day (BID) | ORAL | 0 refills | Status: DC

## 2016-10-22 MED ORDER — IBUPROFEN 100 MG/5ML PO SUSP
600.0000 mg | Freq: Once | ORAL | Status: AC
  Administered 2016-10-22: 600 mg via ORAL
  Filled 2016-10-22: qty 30

## 2016-10-22 MED ORDER — AMOXICILLIN 400 MG/5ML PO SUSR: 500.0000 mg | Freq: Three times a day (TID) | ORAL | 0 refills | Status: AC

## 2016-10-22 MED ORDER — AMOXICILLIN 250 MG/5ML PO SUSR
500.0000 mg | Freq: Once | ORAL | Status: AC
  Administered 2016-10-22: 500 mg via ORAL
  Filled 2016-10-22: qty 10

## 2016-10-22 MED ORDER — AMOXICILLIN 500 MG PO CAPS
500.0000 mg | ORAL_CAPSULE | Freq: Once | ORAL | Status: DC
  Filled 2016-10-22: qty 1

## 2016-10-22 MED ORDER — NAPROXEN 125 MG/5ML PO SUSP: 500.0000 mg | Freq: Once | ORAL | Status: DC

## 2016-10-22 MED ORDER — AMOXICILLIN 500 MG PO CAPS: 500.0000 mg | ORAL_CAPSULE | Freq: Three times a day (TID) | ORAL | 0 refills | Status: AC

## 2016-10-22 MED ORDER — BUPIVACAINE HCL (PF) 0.5 % IJ SOLN
30.0000 mL | Freq: Once | INTRAMUSCULAR | Status: DC
Start: 2016-10-22 — End: 2016-10-23
  Filled 2016-10-22: qty 30

## 2016-10-22 MED ORDER — NAPROXEN 500 MG PO TABS
500.0000 mg | ORAL_TABLET | Freq: Once | ORAL | Status: DC
  Filled 2016-10-22: qty 1

## 2016-10-22 MED ORDER — HYDROCODONE-ACETAMINOPHEN 5-325 MG PO TABS
1.0000 | ORAL_TABLET | Freq: Once | ORAL | Status: DC
  Filled 2016-10-22: qty 1

## 2016-10-22 NOTE — Discharge Instructions (Signed)
OPTIONS FOR DENTAL FOLLOW UP CARE ° °[place] [facility] - Local Safety Net Dental Clinics °http://www.ncdhhs.gov/dph/oralhealth/services/safetynetclinics.htm °  °[facility] (336-562-3123) ° °[facility] [place] (919-933-9087) ° °[facility] ([phone] ext [phone]) ° °[facility] ([phone]) ° °[facility] ([phone]) °This clinic caters to the indigent population and is on a lottery system. °Location: °[facility], [address], [place] °Clinic Hours: °Wednesdays from 6pm - 9pm, patients seen by a lottery system. °For dates, call or go to www.med.[facility].edu/[facility]/patients/Dental-[facility] °Services: °Cleanings, fillings and simple extractions. °Payment Options: °DENTAL WORK IS FREE OF CHARGE. Bring proof of income or support. °Best way to get seen: °Arrive at 5:15 pm - this is a lottery, NOT first come/first serve, so arriving earlier will not increase your chances of being seen. °  °  °[facility] °[phone] °Select option 1 for emergencies °  °Location: °[facility], [address], [place] °Clinic Hours: °No walk-ins accepted - call the day before to schedule an appointment. °Check in times are 9:30 am and 1:30 pm. °Services: °Simple extractions, temporary fillings, pulpectomy/pulp debridement, uncomplicated abscess drainage. °Payment Options: °PAYMENT IS DUE AT THE TIME OF SERVICE.  Fee is usually $100-200, additional surgical procedures (e.g. abscess drainage) may be extra. °Cash, checks, Visa/MasterCard accepted.  Can file Medicaid if patient is covered for dental - patient should call case worker to check. °No discount for [facility] patients. °Best way to get seen: °MUST call the day before and get onto the schedule. Can usually be seen the next 1-2 days. No walk-ins accepted. °  °  °[facility] °[phone] °   °Location: °[facility], [address], [place] °Clinic Hours: °M, W, Th, F 8am or 1:30pm, Tues 9a or 1:30 - first come/first served. °Services: °Simple extractions, temporary fillings, uncomplicated abscess drainage.  You do not need to be an [place] resident. °Payment Options: °PAYMENT IS DUE AT THE TIME OF SERVICE. °Dental insurance, otherwise sliding scale - bring proof of income or support. °Depending on income and treatment needed, cost is usually $50-200. °Best way to get seen: °Arrive early as it is first come/first served. °  °  °[facility] °[phone] °  °Location: °[address] °Clinic Hours: °Mon-Thu 8a-5p °Services: °Most basic dental services including extractions and fillings. °Payment Options: °PAYMENT IS DUE AT THE TIME OF SERVICE. °Sliding scale, up to 50% off - bring proof if income or support. °Medicaid with dental option accepted. °Best way to get seen: °Call to schedule an appointment, can usually be seen within 2 weeks OR they will try to see walk-ins - show up at 8a or 2p (you may have to wait). °  °  °[facility] °919-245-2435 °[place] RESIDENTS ONLY °  °Location: °[facility], [address], [place], [place] [address] °Clinic Hours: By appointment only. °Monday - Thursday 8am-5pm, Friday 8am-12pm °Services: Cleanings, fillings, extractions. °Payment Options: °PAYMENT IS DUE AT THE TIME OF SERVICE. °Cash, Visa or MasterCard. Sliding scale - $30 minimum per service. °Best way to get seen: °Come in to office, complete packet and make an appointment - need proof of income °or support monies for each household member and proof of [place] residence. °Usually takes about a month to get in. °  °  °[facility] °[phone] °  °Location: °[address]   [place] °Clinic Hours: Walk-in Urgent Care Dental Services are offered Monday-Friday  mornings only. °The numbers of emergencies accepted daily is limited to the number of °providers available. °Maximum 15 - Mondays, Wednesdays & Thursdays °Maximum 10 - Tuesdays & Fridays °Services: °You do not need to be a [place] resident to be seen for a dental emergency. °Emergencies are defined as pain, swelling, abnormal bleeding, or dental trauma. Walkins will receive x-rays if needed. °NOTE: Dental cleaning is not an emergency. °Payment Options: °PAYMENT IS DUE AT THE TIME OF SERVICE. °Minimum co-pay is $40.00 for uninsured patients. °Minimum co-pay is $3.00 for Medicaid with dental coverage. °Dental Insurance is accepted and must be presented at time of visit. °Medicare does not cover dental. °Forms of payment: Cash, credit card, checks. °Best way to get seen: °If not previously registered with the clinic, walk-in dental registration begins at 7:15 am and is on a first come/first serve basis. °If previously registered with the clinic, call to make an appointment. °  °  °[facility] °[phone] °[place] RESIDENTS ONLY °  °Location: °[address], [place], [place] °Clinic Hours: °Mon-Thu 10a-2p °Services: Extractions only! °Payment Options: °FREE (donations accepted) - bring proof of income or support °Best way to get seen: °Call and schedule an appointment OR come at 8am on the 1st Monday of every month (except for holidays) when it is first come/first served. °  °  °[facility] °[phone] °  °Location: °[address], [place] °Clinic Hours: °Friday mornings °Services, Payment Options, Best way to get seen: °Call for info °

## 2016-10-22 NOTE — ED Triage Notes (Signed)
Pt reports pain and swelling to her right side of her face. Pain when she bites her teeth together. Started yesterday.

## 2016-10-22 NOTE — ED Provider Notes (Signed)
[facility] Emergency Department Provider Note    First MD Initiated Contact with Patient 10/22/16 2210     (approximate)  I have reviewed the triage vital signs and the nursing notes.   HISTORY  Chief Complaint Dental Pain    HPI Carolyn Davis is a 26 y.o. female chief complaint of pain and swelling to the right lower jaw. States his suddenly started swelling yesterday. Does have a history of dental caries. No fevers. States it hurts to bite down. Denies any trouble swallowing. No change in phonation. No pain under her jaw. No sore throat.   Past Medical History:  Diagnosis Date  . Anemia   . Medical history non-contributory    No family history on file. Past Surgical History:  Procedure Laterality Date  . CESAREAN SECTION    . CESAREAN SECTION N/A 10/15/2014   Procedure: CESAREAN SECTION, Bilateral Tubal Ligation;  Surgeon: Conard NovakStephen D Jackson, MD;  Location: [facility] ORS;  Service: Obstetrics;  Laterality: N/A;  . MYRINGOTOMY     Patient Active Problem List   Diagnosis Date Noted  . Previous cesarean delivery affecting pregnancy, antepartum 10/15/2014  . Delivered by cesarean section 10/15/2014  . S/P cesarean section 10/15/2014  . Obesity affecting pregnancy in third trimester 10/07/2014  . Pregnant and not yet delivered 09/30/2014  . NST (non-stress test) reactive 09/23/2014      Prior to Admission medications   Medication Sig Start Date End Date Taking? Authorizing Provider  amoxicillin (AMOXIL) 500 MG capsule Take 1 capsule (500 mg total) by mouth 3 (three) times daily. 10/22/16 10/29/16  Willy Eddyobinson, Alyzza Andringa, MD  Multiple Vitamin (MULTIVITAMIN) LIQD Take 5 mLs by mouth daily. 10/17/14   Nadara MustardHarris, Robert P, MD  naproxen (NAPROSYN) 375 MG tablet Take 1 tablet (375 mg total) by mouth 2 (two) times daily with a meal. 10/08/16 10/08/17  Jeanmarie PlantMcShane, James A, MD  oxyCODONE (ROXICODONE) 5 MG/5ML solution Take 5-10 mLs (5-10 mg total) by mouth every 4 (four)  hours as needed (pain 5mg  for 4-6and 10mg  for 7-10). 10/17/14   Nadara MustardHarris, Robert P, MD    Allergies Sulfa antibiotics    Social History Social History  Substance Use Topics  . Smoking status: Never Smoker  . Smokeless tobacco: Never Used  . Alcohol use No    Review of Systems Patient denies headaches, rhinorrhea, blurry vision, numbness, shortness of breath, chest pain, edema, cough, abdominal pain, nausea, vomiting, diarrhea, dysuria, fevers, rashes or hallucinations unless otherwise stated above in HPI. ____________________________________________   PHYSICAL EXAM:  VITAL SIGNS: Vitals:   10/22/16 2155  BP: 138/90  Pulse: 92  Temp: 98.4 F (36.9 C)    Constitutional: Alert and oriented. Well appearing and in no acute distress. Eyes: Conjunctivae are normal.  Head: Atraumatic. Nose: No congestion/rhinnorhea. Mouth/Throat: Mucous membranes are moist.  Fluctuant and tender region in the periapical region of teeth 2-4, no sublingual swelling or induration, no trismus Neck: Painless ROM.  Cardiovascular:   Good peripheral circulation. Respiratory: Normal respiratory effort.  No retractions.  Gastrointestinal: Soft and nontender.  Musculoskeletal: No lower extremity tenderness .  No joint effusions. Neurologic:  Normal speech and language. No gross focal neurologic deficits are appreciated.  Skin:  Skin is warm, dry and intact. No rash noted. Psychiatric: Mood and affect are normal. Speech and behavior are normal.  ____________________________________________   LABS (all labs ordered are listed, but only abnormal results are displayed)  No results found for this or any previous visit (from the  past 24 hour(s)). ____________________________________________ ____________________________________________   PROCEDURES  Procedure(s) performed: yes Dental Block Date/Time: 10/22/2016 10:36 PM Performed by: Willy Eddy Authorized by: Willy Eddy   Consent:     Consent obtained:  Verbal   Consent given by:  Patient   Alternatives discussed:  Referral, delayed treatment and alternative treatment Indications:    Indications: dental abscess   Location:    Block type:  Inferior alveolar   Laterality:  Right Procedure details (see MAR for exact dosages):    Needle gauge:  25 G   Anesthetic injected:  Bupivacaine 0.5% w/o epi   Injection procedure:  Anatomic landmarks identified, introduced needle, incremental injection, anatomic landmarks palpated and negative aspiration for blood Post-procedure details:    Outcome:  Anesthesia achieved   Patient tolerance of procedure:  Tolerated well, no immediate complications .Marland KitchenIncision and Drainage Date/Time: 10/22/2016 10:38 PM Performed by: Willy Eddy Authorized by: Willy Eddy   Consent:    Consent obtained:  Verbal   Consent given by:  Patient   Risks discussed:  Bleeding, incomplete drainage, pain and infection   Alternatives discussed:  Delayed treatment, alternative treatment, observation and referral Location:    Type:  Abscess   Location:  Mouth   Mouth location:  Alveolar process Procedure type:    Complexity:  Simple Procedure details:    Incision types:  Stab incision   Incision depth:  Submucosal   Scalpel blade:  11   Drainage:  Bloody and purulent   Drainage amount:  Moderate   Wound treatment:  Wound left open   Packing materials:  None Post-procedure details:    Patient tolerance of procedure:  Tolerated well, no immediate complications      Critical Care performed: no ____________________________________________   INITIAL IMPRESSION / ASSESSMENT AND PLAN / ED COURSE  Pertinent labs & imaging results that were available during my care of the patient were reviewed by me and considered in my medical decision making (see chart for details).  DDX: abscess, caries, fracture, not consistent with ludwigs, pta, rpa  Carolyn Davis is a 26 y.o. who p/w dental pain  last 2 days. No systemic symptoms. No fevers. Afebrile in ED. VSS. Exam as above. Poor dentition. Likely dental caries causing discomfort. clinical exam c/w periapical abscess. focal drainable dental abscess identified. No evidence of Ludwig's, buccal cellulitis, mastoiditis, or airway compromise. I and D performed as above.  Tolerated well.  Will treat pt with ABX, pain medication and dental referral. Low cost dental options handout provided to pt.  Have discussed with the patient and available family all diagnostics and treatments performed thus far and all questions were answered to the best of my ability. The patient demonstrates understanding and agreement with plan.        ____________________________________________   FINAL CLINICAL IMPRESSION(S) / ED DIAGNOSES  Final diagnoses:  Pain, dental  Periapical abscess without sinus      NEW MEDICATIONS STARTED DURING THIS VISIT:  New Prescriptions   AMOXICILLIN (AMOXIL) 500 MG CAPSULE    Take 1 capsule (500 mg total) by mouth 3 (three) times daily.     Note:  This document was prepared using Dragon voice recognition software and may include unintentional dictation errors.     Willy Eddy, MD 10/22/16 2252

## 2017-06-28 ENCOUNTER — Encounter: Payer: Self-pay | Admitting: Emergency Medicine

## 2017-06-28 ENCOUNTER — Emergency Department
Admission: EM | Admit: 2017-06-28 | Discharge: 2017-06-28 | Disposition: A | Discharge status: Home / Self Care | Payer: Self-pay | Attending: Emergency Medicine | Admitting: Emergency Medicine

## 2017-06-28 ENCOUNTER — Other Ambulatory Visit: Payer: Self-pay

## 2017-06-28 DIAGNOSIS — N3001 Acute cystitis with hematuria: Secondary | ICD-10-CM

## 2017-06-28 DIAGNOSIS — N3091 Cystitis, unspecified with hematuria: Secondary | ICD-10-CM | POA: Insufficient documentation

## 2017-06-28 DIAGNOSIS — Z79899 Other long term (current) drug therapy: Secondary | ICD-10-CM | POA: Insufficient documentation

## 2017-06-28 LAB — URINALYSIS, COMPLETE (UACMP) WITH MICROSCOPIC
BILIRUBIN URINE: NEGATIVE
Glucose, UA: NEGATIVE mg/dL
Ketones, ur: NEGATIVE mg/dL
Nitrite: POSITIVE — AB
PH: 6 (ref 5.0–8.0)
Protein, ur: 30 mg/dL — AB
SPECIFIC GRAVITY, URINE: 1.02 (ref 1.005–1.030)

## 2017-06-28 LAB — POCT PREGNANCY, URINE: Preg Test, Ur: NEGATIVE

## 2017-06-28 MED ORDER — CEPHALEXIN 500 MG PO CAPS: 500.0000 mg | ORAL_CAPSULE | Freq: Two times a day (BID) | ORAL | 0 refills | Status: AC

## 2017-06-28 NOTE — ED Triage Notes (Signed)
Presents with painful urination for 1-2 days  Also noticed some blood when wiping

## 2017-06-28 NOTE — ED Provider Notes (Signed)
[facility] Emergency Department Provider Note  ____________________________________________  Time seen: Approximately 10:21 AM  I have reviewed the triage vital signs and the nursing notes.   HISTORY  Chief Complaint Urinary Tract Infection    HPI Carolyn Davis is a 27 y.o. female that presents emergency department for evaluation of urinary frequency and small amounts of blood when wiping for 2 days.  She is not noticing any blood in her urine but there is a small amount on the toilet paper when she wipes.  She has not having any back or abdominal pain.  Last menstrual cycle was 2 weeks ago.  She has never had a kidney stone.  No fever, chills, nausea, vomiting, abdominal pain.  Past Medical History:  Diagnosis Date  . Anemia   . Medical history non-contributory     Patient Active Problem List   Diagnosis Date Noted  . Previous cesarean delivery affecting pregnancy, antepartum 10/15/2014  . Delivered by cesarean section 10/15/2014  . S/P cesarean section 10/15/2014  . Obesity affecting pregnancy in third trimester 10/07/2014  . Pregnant and not yet delivered 09/30/2014  . NST (non-stress test) reactive 09/23/2014    Past Surgical History:  Procedure Laterality Date  . CESAREAN SECTION    . CESAREAN SECTION N/A 10/15/2014   Procedure: CESAREAN SECTION, Bilateral Tubal Ligation;  Surgeon: Conard NovakStephen D Jackson, MD;  Location: [facility] ORS;  Service: Obstetrics;  Laterality: N/A;  . MYRINGOTOMY      Prior to Admission medications   Medication Sig Start Date End Date Taking? Authorizing Provider  cephALEXin (KEFLEX) 500 MG capsule Take 1 capsule (500 mg total) by mouth 2 (two) times daily for 10 days. 06/28/17 07/08/17  Enid DerryWagner, Almina Schul, PA-C  Multiple Vitamin (MULTIVITAMIN) LIQD Take 5 mLs by mouth daily. 10/17/14   Nadara MustardHarris, Robert P, MD  naproxen (NAPROSYN) 125 MG/5ML suspension Take 20 mLs (500 mg total) by mouth 2 (two) times daily with a meal. 10/22/16   Enid DerryWagner,  Onyx Schirmer, PA-C  oxyCODONE (ROXICODONE) 5 MG/5ML solution Take 5-10 mLs (5-10 mg total) by mouth every 4 (four) hours as needed (pain 5mg  for 4-6and 10mg  for 7-10). 10/17/14   Nadara MustardHarris, Robert P, MD    Allergies Sulfa antibiotics  No family history on file.  Social History Social History   Tobacco Use  . Smoking status: Never Smoker  . Smokeless tobacco: Never Used  Substance Use Topics  . Alcohol use: No  . Drug use: No     Review of Systems  Constitutional: No fever/chills Cardiovascular: No chest pain. Respiratory:  No SOB. Gastrointestinal: No abdominal pain.  No nausea, no vomiting.  Musculoskeletal: Negative for musculoskeletal pain. Skin: Negative for rash, abrasions, lacerations, ecchymosis.   ____________________________________________   PHYSICAL EXAM:  VITAL SIGNS: ED Triage Vitals  Enc Vitals Group     BP 06/28/17 0849 109/85     Pulse Rate 06/28/17 0849 65     Resp 06/28/17 0849 20     Temp 06/28/17 0849 98.1 F (36.7 C)     Temp Source 06/28/17 0849 Oral     SpO2 06/28/17 0849 100 %     Weight 06/28/17 0850 240 lb (108.9 kg)     Height 06/28/17 0850 5\' 2"  (1.575 m)     Head Circumference --      Peak Flow --      Pain Score 06/28/17 0850 9     Pain Loc --      Pain Edu? --  Excl. in GC? --      Constitutional: Alert and oriented. Well appearing and in no acute distress. Eyes: Conjunctivae are normal. PERRL. EOMI. Head: Atraumatic. ENT:      Ears:      Nose: No congestion/rhinnorhea.      Mouth/Throat: Mucous membranes are moist.  Neck: No stridor.   Cardiovascular: Normal rate, regular rhythm.  Good peripheral circulation. Respiratory: Normal respiratory effort without tachypnea or retractions. Lungs CTAB. Good air entry to the bases with no decreased or absent breath sounds. Gastrointestinal: Bowel sounds 4 quadrants. Soft and nontender to palpation. No guarding or rigidity. No palpable masses. No distention.  No CVA  tenderness. Musculoskeletal: Full range of motion to all extremities. No gross deformities appreciated. Neurologic:  Normal speech and language. No gross focal neurologic deficits are appreciated.  Skin:  Skin is warm, dry and intact. No rash noted.   ____________________________________________   LABS (all labs ordered are listed, but only abnormal results are displayed)  Labs Reviewed  URINALYSIS, COMPLETE (UACMP) WITH MICROSCOPIC - Abnormal; Notable for the following components:      Result Value   Color, Urine YELLOW (*)    APPearance CLOUDY (*)    Hgb urine dipstick MODERATE (*)    Protein, ur 30 (*)    Nitrite POSITIVE (*)    Leukocytes, UA TRACE (*)    Bacteria, UA RARE (*)    Squamous Epithelial / LPF 0-5 (*)    All other components within normal limits  POC URINE PREG, ED  POCT PREGNANCY, URINE   ____________________________________________  EKG   ____________________________________________  RADIOLOGY   No results found.  ____________________________________________    PROCEDURES  Procedure(s) performed:    Procedures    Medications - No data to display   ____________________________________________   INITIAL IMPRESSION / ASSESSMENT AND PLAN / ED COURSE  Pertinent labs & imaging results that were available during my care of the patient were reviewed by me and considered in my medical decision making (see chart for details).  Review of the [place] CSRS was performed in accordance of the [facility] prior to dispensing any controlled drugs.   Patient's diagnosis is consistent with urinary tract infection.  Vital signs and exam are reassuring.  Urinalysis consistent with infection.  Patient is not having back or abdominal pain so renal stone is unlikely.  This was discussed with patient and she is in agreement to not do a CT scan.  Patient will be discharged home with prescriptions for Keflex. Patient is to follow up with PCP as directed. Patient is given ED  precautions to return to the ED for any worsening or new symptoms.     ____________________________________________  FINAL CLINICAL IMPRESSION(S) / ED DIAGNOSES  Final diagnoses:  Acute cystitis with hematuria      NEW MEDICATIONS STARTED DURING THIS VISIT:  ED Discharge Orders        Ordered    cephALEXin (KEFLEX) 500 MG capsule  2 times daily     06/28/17 1052          This chart was dictated using voice recognition software/Dragon. Despite best efforts to proofread, errors can occur which can change the meaning. Any change was purely unintentional.    Enid Derry, PA-C 06/28/17 1211    Phineas Semen, MD 06/28/17 1321

## 2017-06-28 NOTE — ED Notes (Signed)
UA PREG result = NEG 

## 2018-08-28 ENCOUNTER — Other Ambulatory Visit: Payer: Self-pay

## 2018-08-28 ENCOUNTER — Encounter: Payer: Self-pay | Admitting: Emergency Medicine

## 2018-08-28 ENCOUNTER — Ambulatory Visit
Admission: EM | Admit: 2018-08-28 | Discharge: 2018-08-28 | Disposition: A | Discharge status: Home / Self Care | Payer: Self-pay | Attending: Family Medicine | Admitting: Family Medicine

## 2018-08-28 DIAGNOSIS — N63 Unspecified lump in unspecified breast: Secondary | ICD-10-CM

## 2018-08-28 DIAGNOSIS — N6452 Nipple discharge: Secondary | ICD-10-CM

## 2018-08-28 NOTE — ED Triage Notes (Signed)
Patient c/o right breast pain and tender lump that started yesterday.  Patient also reports green discharge from her nipple yesterday.  Patient denies fevers.

## 2018-08-28 NOTE — ED Provider Notes (Signed)
[facility]    CSN: [id number] Arrival date & time: 08/28/18  1500     History   Chief Complaint Chief Complaint  Patient presents with  . Breast Pain  . Breast Mass    HPI Carolyn Davis is a 28 y.o. female.   28 yo female with a c/o right breast pain and tender lump that she noticed yesterday. Also states she had some green discharge from her nipple. Denies any fevers, skin redness, rash, injury/trauma.   The history is provided by the patient.    Past Medical History:  Diagnosis Date  . Anemia   . Medical history non-contributory     Patient Active Problem List   Diagnosis Date Noted  . Previous cesarean delivery affecting pregnancy, antepartum 10/15/2014  . Delivered by cesarean section 10/15/2014  . S/P cesarean section 10/15/2014  . Obesity affecting pregnancy in third trimester 10/07/2014  . Pregnant and not yet delivered 09/30/2014  . NST (non-stress test) reactive 09/23/2014    Past Surgical History:  Procedure Laterality Date  . CESAREAN SECTION    . CESAREAN SECTION N/A 10/15/2014   Procedure: CESAREAN SECTION, Bilateral Tubal Ligation;  Surgeon: Conard Novak, MD;  Location: [facility] ORS;  Service: Obstetrics;  Laterality: N/A;  . MYRINGOTOMY      OB History    Gravida  2   Para  2   Term  2   Preterm  0   AB  0   Living  0     SAB  0   TAB  0   Ectopic  0   Multiple  0   Live Births               Home Medications    Prior to Admission medications   Medication Sig Start Date End Date Taking? Authorizing Provider  Multiple Vitamin (MULTIVITAMIN) LIQD Take 5 mLs by mouth daily. 10/17/14   Nadara Mustard, MD  naproxen (NAPROSYN) 125 MG/5ML suspension Take 20 mLs (500 mg total) by mouth 2 (two) times daily with a meal. 10/22/16   Enid Derry, PA-C  oxyCODONE (ROXICODONE) 5 MG/5ML solution Take 5-10 mLs (5-10 mg total) by mouth every 4 (four) hours as needed (pain 5mg  for 4-6and 10mg  for 7-10). 10/17/14   Nadara Mustard, MD    Family History Family History  Problem Relation Age of Onset  . Hypertension Mother   . Diabetes Mother     Social History Social History   Tobacco Use  . Smoking status: Never Smoker  . Smokeless tobacco: Never Used  Substance Use Topics  . Alcohol use: No  . Drug use: No     Allergies   Sulfa antibiotics   Review of Systems Review of Systems   Physical Exam Triage Vital Signs ED Triage Vitals  Enc Vitals Group     BP 08/28/18 1513 131/83     Pulse Rate 08/28/18 1513 85     Resp 08/28/18 1513 16     Temp 08/28/18 1513 98.1 F (36.7 C)     Temp Source 08/28/18 1513 Oral     SpO2 08/28/18 1513 99 %     Weight 08/28/18 1510 240 lb (108.9 kg)     Height 08/28/18 1510 5\' 1"  (1.549 m)     Head Circumference --      Peak Flow --      Pain Score 08/28/18 1510 5     Pain Loc --  Pain Edu? --      Excl. in GC? --    No data found.  Updated Vital Signs BP 131/83 (BP Location: Left Arm)   Pulse 85   Temp 98.1 F (36.7 C) (Oral)   Resp 16   Ht 5\' 1"  (1.549 m)   Wt 108.9 kg   LMP 08/19/2018 (Approximate)   SpO2 99%   Breastfeeding No   BMI 45.35 kg/m   Visual Acuity Right Eye Distance:   Left Eye Distance:   Bilateral Distance:    Right Eye Near:   Left Eye Near:    Bilateral Near:     Physical Exam Vitals signs reviewed. Exam conducted with a chaperone present Herbert Seta(Heather, RN).  Constitutional:      General: She is not in acute distress.    Appearance: She is not toxic-appearing or diaphoretic.  Chest:     Breasts: Breasts are symmetrical.        Right: Mass (approx 9 o'clock position just outside the areola) and tenderness present. No swelling, bleeding, inverted nipple, nipple discharge or skin change.        Left: Normal.  Lymphadenopathy:     Upper Body:     Right upper body: No supraclavicular, axillary or pectoral adenopathy.  Neurological:     Mental Status: She is alert.      UC Treatments / Results  Labs (all  labs ordered are listed, but only abnormal results are displayed) Labs Reviewed - No data to display  EKG None  Radiology No results found.  Procedures Procedures (including critical care time)  Medications Ordered in UC Medications - No data to display  Initial Impression / Assessment and Plan / UC Course  I have reviewed the triage vital signs and the nursing notes.  Pertinent labs & imaging results that were available during my care of the patient were reviewed by me and considered in my medical decision making (see chart for details).      Final Clinical Impressions(s) / UC Diagnoses   Final diagnoses:  Breast lump in female  Breast discharge     Discharge Instructions     [facility]    ED Prescriptions    None      1. diagnosis reviewed with patient 2. Recommend follow up with general breast surgeon for further evaluation and management.   Controlled Substance Prescriptions [place] Controlled Substance Registry consulted? Not Applicable   Payton Mccallumonty, Marcy Bogosian, MD 08/28/18 1740

## 2018-08-28 NOTE — Discharge Instructions (Signed)
Follow up with Dr. Earlene Plater at [facility] tomorrow

## 2018-08-29 ENCOUNTER — Ambulatory Visit (INDEPENDENT_AMBULATORY_CARE_PROVIDER_SITE_OTHER): Payer: Self-pay | Admitting: Surgery

## 2018-08-29 ENCOUNTER — Ambulatory Visit: Payer: Self-pay

## 2018-08-29 ENCOUNTER — Other Ambulatory Visit: Payer: Self-pay

## 2018-08-29 ENCOUNTER — Encounter: Payer: Self-pay | Admitting: Surgery

## 2018-08-29 VITALS — BP 125/81 | HR 82 | Temp 98.8°F | Resp 18 | Ht 61.0 in | Wt 268.4 lb

## 2018-08-29 DIAGNOSIS — N6452 Nipple discharge: Secondary | ICD-10-CM

## 2018-08-29 DIAGNOSIS — N6310 Unspecified lump in the right breast, unspecified quadrant: Secondary | ICD-10-CM

## 2018-08-29 NOTE — Progress Notes (Signed)
Surgical Clinic History and Physical  Referring provider:  Payton Mccallum, MD [facility]  HISTORY OF PRESENT ILLNESS (HPI):  28 y.o. female presents for evaluation of nipple discharge and new Right breast mass. Patient reports she 2 days ago noticed sticky greenish-yellow Right nipple discharge and a painful Right breast mass, for which she presented to [facility] and was referred accordingly. Upon further questioning, patient has not since 2 days ago noted any further Right nipple discharge, and her pain has improved slightly. Patient denies any breast trauma and describes she breast fed both her 28 year old and 28 year old children. She often experiences B/L breast pain prior to menstruation, though her last menstruation was 4 days ago. She otherwise denies prior breast mass, nipple discharge, imaging, biopsies, or family history of breast cancer and denies fever/chills, unintentional weight loss, N/V, CP, or SOB.  PAST MEDICAL HISTORY (PMH):  Past Medical History:  Diagnosis Date  . Anemia   . Medical history non-contributory     PAST SURGICAL HISTORY (PSH):  Past Surgical History:  Procedure Laterality Date  . CESAREAN SECTION    . CESAREAN SECTION N/A 10/15/2014   Procedure: CESAREAN SECTION, Bilateral Tubal Ligation;  Surgeon: Conard Novak, MD;  Location: [facility] ORS;  Service: Obstetrics;  Laterality: N/A;  . MYRINGOTOMY      MEDICATIONS:  Prior to Admission medications   Not on File    ALLERGIES:  Allergies  Allergen Reactions  . Sulfa Antibiotics Rash    SOCIAL HISTORY:  Social History   Socioeconomic History  . Marital status: Single    Spouse name: Not on file  . Number of children: Not on file  . Years of education: Not on file  . Highest education level: Not on file  Occupational History  . Not on file  Social Needs  . Financial resource strain: Not on file  . Food insecurity:    Worry: Not on file    Inability: Not on file  .  Transportation needs:    Medical: Not on file    Non-medical: Not on file  Tobacco Use  . Smoking status: Never Smoker  . Smokeless tobacco: Never Used  Substance and Sexual Activity  . Alcohol use: No  . Drug use: No  . Sexual activity: Yes    Birth control/protection: None    Comment: pregnant  Lifestyle  . Physical activity:    Days per week: Not on file    Minutes per session: Not on file  . Stress: Not on file  Relationships  . Social connections:    Talks on phone: Not on file    Gets together: Not on file    Attends religious service: Not on file    Active member of club or organization: Not on file    Attends meetings of clubs or organizations: Not on file    Relationship status: Not on file  . Intimate partner violence:    Fear of current or ex partner: Not on file    Emotionally abused: Not on file    Physically abused: Not on file    Forced sexual activity: Not on file  Other Topics Concern  . Not on file  Social History Narrative  . Not on file    The patient currently resides (home / rehab facility / nursing home): Home The patient normally is (ambulatory / bedbound): Ambulatory  FAMILY HISTORY:  Family History  Problem Relation Age of Onset  . Hypertension  Mother   . Diabetes Mother     Otherwise negative/non-contributory.  REVIEW OF SYSTEMS:  Constitutional: denies any other weight loss, fever, chills, or sweats  Eyes: denies any other vision changes, history of eye injury  ENT: denies sore throat, hearing problems  Respiratory: denies shortness of breath, wheezing  Cardiovascular: denies chest pain, palpitations Breast: Right nipple discharge and breast pain as per HPI Gastrointestinal: denies abdominal pain, N/V, or diarrhea Musculoskeletal: denies any other joint pains or cramps  Skin: Denies any other rashes or skin discolorations Neurological: denies any other headache, dizziness, weakness  Psychiatric: Denies any other depression, anxiety    All other review of systems were otherwise negative   VITAL SIGNS:  BP 125/81   Pulse 82   Temp 98.8 F (37.1 C) (Temporal)   Resp 18   Ht 5\' 1"  (1.549 m)   Wt 268 lb 6.4 oz (121.7 kg)   LMP 08/19/2018 (Approximate)   SpO2 96%   BMI 50.71 kg/m   PHYSICAL EXAM:  Constitutional:  -- Obese body habitus  -- Awake, alert, and oriented x3  Eyes:  -- Pupils equally round and reactive to light  -- No scleral icterus  Ear, nose, throat: -- Very poor dentition -- No jugular venous distension -- No nasal drainage, bleeding Pulmonary:  -- No crackles  -- Equal breath sounds bilaterally -- Breathing non-labored at rest Cardiovascular:  -- S1, S2 present  -- No pericardial rubs  Breasts: -- 4-5 mm tender to palpation firm mobile subareolar nodule at 9 o'clock position -- No other breast mass(es) or axillary lymphadenopathy, no nipple discharge able to be expressed Gastrointestinal:  -- Abdomen soft, non-tender to palpation, non-distended, no guarding/rebound tenderness -- No abdominal masses appreciated, pulsatile or otherwise  Musculoskeletal and Integumentary:  -- Wounds or skin discoloration: None appreciated -- Extremities: B/L UE and LE FROM, hands and feet warm  Neurologic:  -- Motor function: Intact and symmetric -- Sensation: Intact and symmetric  Labs:  CBC Latest Ref Rng & Units 10/16/2014 10/14/2014  WBC 3.6 - 11.0 K/uL 16.6(H) 14.3(H)  Hemoglobin 12.0 - 16.0 g/dL 1.1(B8.6(L) 10.6(L)  Hematocrit 35.0 - 47.0 % 26.9(L) 33.2(L)  Platelets 150 - 440 K/uL 245 281   BMP: No results found for: GLUCOSE, POTASSIUM, CHLORIDE, CO2, BUN, CREATININE, CALCIUM   Imaging studies: No recent pertinent imaging studies available for review at this time   Assessment/Plan:  28 y.o. 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   - will order formal Right breast ultrasound  - meanwhile, warm compresses and NSAID's for pain relief  - discussed with patient most likely etiologies (ductal ectasia, fibrocystic disease), though indication for breast imaging to evaluate for less likely malignancy  - also discussed possibility of needle aspiration if persistently painful cyst   - will determine in-person vs virtual follow-up appointment after ultrasound based on imaging results and whether or not patient's pain persists  - instructed to call if any questions or concerns  All of the above recommendations were discussed with the patient, and all of patient's questions were answered to her expressed satisfaction.  Thank you for the opportunity to participate in this patient's care.  -- Scherrie GerlachJason E. Earlene Plateravis, MD, RPVI [place]: [facility] General Surgery - Partnering for exceptional care. Office:  [phone] 

## 2018-08-29 NOTE — Patient Instructions (Addendum)
Your Ultrasound is scheduled for 09/02/2018 @ 11:40 am at [facility]  You will enter through [address] entrance of the hospital, check in at third desk on right and they will direct you to Mammography.    We will call you with the results of your right breast ultrasound results.  If you continue to experience pain let us know when we call you with the results.     Breast Self-Awareness Breast self-awareness means:  Knowing how your breasts look.  Knowing how your breasts feel.  Checking your breasts every month for changes.  Telling your doctor if you notice a change in your breasts. Breast self-awareness allows you to notice a breast problem early while it is still small. How to do a breast self-exam One way to learn what is normal for your breasts and to check for changes is to do a breast self-exam. To do a breast self-exam: Look for Changes  1. Take off all the clothes above your waist. 2. Stand in front of a mirror in a room with good lighting. 3. Put your hands on your hips. 4. Push your hands down. 5. Look at your breasts and nipples in the mirror to see if one breast or nipple looks different than the other. Check to see if: ? The shape of one breast is different. ? The size of one breast is different. ? There are wrinkles, dips, and bumps in one breast and not the other. 6. Look at each breast for changes in your skin, such as: ? Redness. ? Scaly areas. 7. Look for changes in your nipples, such as: ? Liquid around the nipples. ? Bleeding. ? Dimpling. ? Redness. ? A change in where the nipples are. Feel for Changes 1. Lie on your back on the floor. 2. Feel each breast. To do this, follow these steps: ? Pick a breast to feel. ? Put the arm closest to that breast above your head. ? Use your other arm to feel the nipple area of your breast. Feel the area with the pads of your three middle fingers by making small circles with your fingers. For the  first circle, press lightly. For the second circle, press harder. For the third circle, press even harder. ? Keep making circles with your fingers at the light, harder, and even harder pressures as you move down your breast. Stop when you feel your ribs. ? Move your fingers a little toward the center of your body. ? Start making circles with your fingers again, this time going up until you reach your collarbone. ? Keep making up and down circles until you reach your armpit. Remember to keep using the three pressures. ? Feel the other breast in the same way. 3. Sit or stand in the shower or tub. 4. With soapy water on your skin, feel each breast the same way you did in step 2, when you were lying on the floor. Write Down What You Find After doing the self-exam, write down:  What is normal for each breast.  Any changes you find in each breast.  When you last had your period.  How often should I check my breasts? Check your breasts every month. If you are breastfeeding, the best time to check them is after you feed your baby or after you use a breast pump. If you get periods, the best time to check your breasts is 5-7 days after your period is over. When should I see my doctor? See  your doctor if you notice:  A change in shape or size of your breasts or nipples.  A change in the skin of your breast or nipples, such as red or scaly skin.  Unusual fluid coming from your nipples.  A lump or thick area that was not there before.  Pain in your breasts.  Anything that concerns you. This information is not intended to replace advice given to you by your health care provider. Make sure you discuss any questions you have with your health care provider. Document Released: 10/25/2007 Document Revised: 10/14/2015 Document Reviewed: 03/28/2015 Elsevier Interactive Patient Education  2019 [employer]

## 2018-09-02 ENCOUNTER — Other Ambulatory Visit: Payer: Self-pay

## 2018-09-02 ENCOUNTER — Ambulatory Visit
Admission: RE | Admit: 2018-09-02 | Discharge: 2018-09-02 | Disposition: A | Discharge status: Home / Self Care | Payer: Medicaid Other | Source: Ambulatory Visit | Attending: Surgery | Admitting: Surgery

## 2018-09-02 DIAGNOSIS — N6310 Unspecified lump in the right breast, unspecified quadrant: Secondary | ICD-10-CM | POA: Insufficient documentation

## 2018-09-10 ENCOUNTER — Telehealth: Payer: Self-pay

## 2018-09-10 NOTE — Telephone Encounter (Signed)
Error

## 2018-09-12 ENCOUNTER — Other Ambulatory Visit: Payer: Self-pay

## 2018-09-12 ENCOUNTER — Telehealth (INDEPENDENT_AMBULATORY_CARE_PROVIDER_SITE_OTHER): Payer: Self-pay | Admitting: Surgery

## 2018-09-12 DIAGNOSIS — N6452 Nipple discharge: Secondary | ICD-10-CM

## 2018-09-12 DIAGNOSIS — N644 Mastodynia: Secondary | ICD-10-CM | POA: Insufficient documentation

## 2018-09-12 NOTE — Progress Notes (Signed)
Virtual Visit via Telemedicine Note  I connected with Carolyn Davis by telephone at her home on 09/12/18 at  1:30 PM EDT and verified that I was speaking with the correct person using their name and date of birth.   I discussed the limitations, risks, security and privacy concerns of performing an evaluation and management service by telephonic telemedicine and the availability of in person appointments. I also discussed with the patient that there may be a patient responsible charge related to this service. The patient expressed understanding and agreed to proceed.  History of Present Illness: 28 y.o. Female is being evaluated for Right breast pain and nipple discharge. Patient reports complete resolution of both presenting symptoms and has been feeling well, denies N/V, fever/chills, CP, or SOB. Patient further denies any appreciable Right breast redness or masses.  Review of Systems:  Constitutional: denies fever/chills  Respiratory: denies shortness of breath, wheezing  Cardiovascular: denies chest pain, palpitations  Breast: pain, nipple discharge/drainage, and mass(es) as per interval history Skin: Denies any other rashes or skin discolorations  Imaging:  Right Breast Ultrasound (09/02/2018) No sonographic evidence of malignancy. No sonographic cause for the patient's pain identified.  Assessment:  28 y.o. yo Female with a problem list including...  Patient Active Problem List   Diagnosis Date Noted  . Previous cesarean delivery affecting pregnancy, antepartum 10/15/2014  . Delivered by cesarean section 10/15/2014  . S/P cesarean section 10/15/2014  . Obesity affecting pregnancy in third trimester 10/07/2014  . Pregnant and not yet delivered 09/30/2014  . NST (non-stress test) reactive 09/23/2014    doing well at time of current follow-up encounter/evaluation for resolved Right breast painful mass and nipple drainage.  Follow-up Instructions / Plan:    - patient's imaging  results were discussed  - screening mammogram and follow-up in 13 years             - discussed with patient most likely etiologies (ductal ectasia, fibrocystic disease)             - patient advised she may be contacted to schedule a subsequent appointment/exam when such routine follow-up appointments may safely be performed, though patient may certainly decline a subsequent appointment if wishes to do so             - otherwise, return to clinic as needed, instructed to call office if any questions or concerns  All of the above recommendations were discussed with the patient, and all of patient's questions were answered to her expressed satisfaction.  The patient was advised to call back or seek an in-person evaluation if the symptoms worsen or if the condition fails to improve as anticipated.  From [facility] outpatient surgery office, I provided 15 minutes of non-face-to-face time during this encounter.  -- Scherrie Gerlach Earlene Plater, MD, RPVI [place]: [facility] General [facility] - Partnering for exceptional care. Office: [phone]

## 2018-12-01 ENCOUNTER — Other Ambulatory Visit: Payer: Self-pay

## 2018-12-01 ENCOUNTER — Encounter: Payer: Self-pay | Admitting: Emergency Medicine

## 2018-12-01 ENCOUNTER — Emergency Department
Admission: EM | Admit: 2018-12-01 | Discharge: 2018-12-01 | Disposition: A | Discharge status: Home / Self Care | Payer: Medicaid Other | Attending: Emergency Medicine | Admitting: Emergency Medicine

## 2018-12-01 DIAGNOSIS — K029 Dental caries, unspecified: Secondary | ICD-10-CM | POA: Insufficient documentation

## 2018-12-01 MED ORDER — AMOXICILLIN 500 MG PO CAPS
500.0000 mg | ORAL_CAPSULE | Freq: Once | ORAL | Status: AC
  Administered 2018-12-01: 500 mg via ORAL
  Filled 2018-12-01: qty 1

## 2018-12-01 MED ORDER — TRAMADOL HCL 50 MG PO TABS: 50.0000 mg | ORAL_TABLET | Freq: Three times a day (TID) | ORAL | 0 refills | Status: AC | PRN

## 2018-12-01 MED ORDER — LIDOCAINE-EPINEPHRINE 2 %-1:100000 IJ SOLN
1.7000 mL | Freq: Once | INTRAMUSCULAR | Status: AC
  Administered 2018-12-01: 1.7 mL
  Filled 2018-12-01: qty 1.7

## 2018-12-01 MED ORDER — AMOXICILLIN 500 MG PO CAPS: 500.0000 mg | ORAL_CAPSULE | Freq: Three times a day (TID) | ORAL | 0 refills | Status: DC

## 2018-12-01 NOTE — Discharge Instructions (Signed)
You are being treated for dental pain and a possible dental infection. Take the prescription antibiotic as directed. Follow-up with one of the dental clinics on the list provided.   OPTIONS FOR DENTAL FOLLOW UP CARE  [place] [facility] and [place] [facility] [phone])  [facility] [phone])  [place] [phone])  [place] [phone])  [facility] [phone]) This clinic caters to the indigent population and is on a lottery system. Location: [employer] [facility], [employer], [place], [place] Clinic Hours: Wednesdays from 6pm - 9pm, patients seen by a lottery system. For dates, call or go to [email or web address] Services: Cleanings, fillings and simple extractions. Payment Options: DENTAL WORK IS FREE OF CHARGE. Bring proof of income or support. Best way to get seen: Arrive at 5:15 pm - this is a lottery, NOT first come/first serve, so arriving earlier will not increase your chances of being seen.     [facility] [phone] Select option 1 for emergencies   Location: [facility], [place], [address] [place] Clinic Hours: No walk-ins accepted - call the day before to schedule an appointment. Check in times are 9:30 am and 1:30 pm. Services: Simple extractions, temporary fillings, pulpectomy/pulp debridement, uncomplicated abscess drainage. Payment Options: PAYMENT IS DUE AT THE TIME OF SERVICE.  Fee is usually $100-200, additional surgical procedures (e.g. abscess drainage) may be extra. Cash, checks, Visa/MasterCard accepted.  Can file Medicaid if patient is covered for dental - patient should call case worker to check. No discount for [facility] patients. Best way to  get seen: MUST call the day before and get onto the schedule. Can usually be seen the next 1-2 days. No walk-ins accepted.     [place] [phone]   Location: [place], [place] Clinic Hours: M, W, Th, F 8am or 1:30pm, Tues 9a or 1:30 - first come/first served. Services: Simple extractions, temporary fillings, uncomplicated abscess drainage.  You do not need to be an [facility] resident. Payment Options: PAYMENT IS DUE AT THE TIME OF SERVICE. Dental insurance, otherwise sliding scale - bring proof of income or support. Depending on income and treatment needed, cost is usually $50-200. Best way to get seen: Arrive early as it is first come/first served.     [facility] [phone]   Location: [place] [facility] Hours: Mon-Thu 8a-5p Services: Most basic dental services including extractions and fillings. Payment Options: PAYMENT IS DUE AT THE TIME OF SERVICE. Sliding scale, up to 50% off - bring proof if income or support. Medicaid with dental option accepted. Best way to get seen: Call to schedule an appointment, can usually be seen within 2 weeks OR they will try to see walk-ins - show up at [place] or 2p (you may have to wait).     [facility] [place] RESIDENTS ONLY   Location: [facility], [place] [address], [place], [place] [address] Clinic Hours: By appointment only. Monday - Thursday 8am-5pm, Friday 8am-12pm Services: Cleanings, fillings, extractions. Payment Options: PAYMENT IS DUE AT THE TIME OF SERVICE. Cash, Visa or MasterCard. Sliding scale - $30 minimum per service. Best way to get seen: Come in to office, complete packet and make an appointment - need proof of income or support monies for each household  member and proof of [facility] residence. Usually takes about a month to get in.     [facility] [phone]   Location: [address], [place] Clinic Hours: Walk-in Urgent Care Dental Services are offered Monday-Friday mornings only. The numbers of emergencies accepted daily is limited to the number of providers available. Maximum 15 - Mondays, Wednesdays & Thursdays Maximum 10 - Tuesdays & Fridays Services: You do not need to be a [facility] resident to be seen for a dental emergency. Emergencies are defined as pain, swelling, abnormal bleeding, or dental trauma. Walkins will receive x-rays if needed. NOTE: Dental cleaning is not an emergency. Payment Options: PAYMENT IS DUE AT THE TIME OF SERVICE. Minimum co-pay is $40.00 for uninsured patients. Minimum co-pay is $3.00 for Medicaid with dental coverage. Dental Insurance is accepted and must be presented at time of visit. Medicare does not cover dental. Forms of payment: Cash, credit card, checks. Best way to get seen: If not previously registered with the clinic, walk-in dental registration begins at 7:15 am and is on a first come/first serve basis. If previously registered with the clinic, call to make an appointment.     [facility] [place] ONLY   Location: [address], [place], [place] Clinic Hours: Mon-Thu 10a-2p Services: Extractions only! Payment Options: FREE (donations accepted) - bring proof of income or support Best way to get seen: Call and schedule an appointment OR come at 8am on the 1st Monday of every month (except for holidays) when it is first come/first served.     [facility] [phone]   Location: [place], [place] Clinic Hours: Friday mornings Services, Payment Options, Best way to get seen: Call for info

## 2018-12-01 NOTE — ED Provider Notes (Signed)
[facility] Emergency Department Provider Note ____________________________________________  Time seen: 2244  I have reviewed the triage vital signs and the nursing notes.  HISTORY  Chief Complaint  Dental Pain  HPI Carolyn Davis is a 28 y.o. female presents to the ED for evaluation of pain to the right lower jaw secondary to a dental complaint.  Patient denies any interim fevers, chills, sweats.  She denies any spontaneous purulent drainage, difficulty controlling secretions, or oral swelling.  Past Medical History:  Diagnosis Date  . Anemia   . Medical history non-contributory     Patient Active Problem List   Diagnosis Date Noted  . Pain of right breast 09/12/2018  . Discharge from right nipple 09/12/2018  . Previous cesarean delivery affecting pregnancy, antepartum 10/15/2014  . Delivered by cesarean section 10/15/2014  . S/P cesarean section 10/15/2014  . Obesity affecting pregnancy in third trimester 10/07/2014  . Pregnant and not yet delivered 09/30/2014  . NST (non-stress test) reactive 09/23/2014    Past Surgical History:  Procedure Laterality Date  . CESAREAN SECTION    . CESAREAN SECTION N/A 10/15/2014   Procedure: CESAREAN SECTION, Bilateral Tubal Ligation;  Surgeon: Will Bonnet, MD;  Location: [facility] ORS;  Service: Obstetrics;  Laterality: N/A;  . MYRINGOTOMY      Prior to Admission medications   Medication Sig Start Date End Date Taking? Authorizing Provider  amoxicillin (AMOXIL) 500 MG capsule Take 1 capsule (500 mg total) by mouth 3 (three) times daily. 12/01/18   Zimal Weisensel, Dannielle Karvonen, PA-C  traMADol (ULTRAM) 50 MG tablet Take 1 tablet (50 mg total) by mouth 3 (three) times daily as needed for up to 2 days. 12/01/18 12/03/18  Skilar Marcou, Dannielle Karvonen, PA-C    Allergies Sulfa antibiotics  Family History  Problem Relation Age of Onset  . Hypertension Mother   . Diabetes Mother     Social History Social History   Tobacco  Use  . Smoking status: Never Smoker  . Smokeless tobacco: Never Used  Substance Use Topics  . Alcohol use: No  . Drug use: No    Review of Systems  Constitutional: Negative for fever. Eyes: Negative for visual changes. ENT: Negative for sore throat.  Dental pain as above. Cardiovascular: Negative for chest pain. Respiratory: Negative for shortness of breath. Gastrointestinal: Negative for abdominal pain, vomiting and diarrhea. Genitourinary: Negative for dysuria. Musculoskeletal: Negative for back pain. Skin: Negative for rash. Neurological: Negative for headaches, focal weakness or numbness. ____________________________________________  PHYSICAL EXAM:  VITAL SIGNS: ED Triage Vitals  Enc Vitals Group     BP 12/01/18 2154 (!) 158/98     Pulse Rate 12/01/18 2154 68     Resp 12/01/18 2154 18     Temp 12/01/18 2154 98.4 F (36.9 C)     Temp Source 12/01/18 2154 Oral     SpO2 12/01/18 2154 97 %     Weight --      Height 12/01/18 2153 5\' 1"  (1.549 m)     Head Circumference --      Peak Flow --      Pain Score 12/01/18 2153 7     Pain Loc --      Pain Edu? --      Excl. in [place]? --     Constitutional: Alert and oriented. Well appearing and in no distress. Head: Normocephalic and atraumatic. Eyes: Conjunctivae are normal. Normal extraocular movements Mouth/Throat: Mucous membranes are moist.  Uvula is midline and tonsils  are flat.  Generalized dental decay and mild gum disease noted.  Patient with a chronically broken right lower first molar.  No focal gum swelling, erythema, or edema is appreciated.  No brawny sublingual edema is noted. Neck: Supple. No thyromegaly. Hematological/Lymphatic/Immunological: No cervical lymphadenopathy. Cardiovascular: Normal rate, regular rhythm. Normal distal pulses. Respiratory: Normal respiratory effort.  Musculoskeletal: Nontender with normal range of motion in all extremities.  Neurologic:  Normal gait without ataxia. Normal speech and  language. No gross focal neurologic deficits are appreciated. Skin:  Skin is warm, dry and intact. No rash noted. Psychiatric: Mood and affect are normal. Patient exhibits appropriate insight and judgment. ____________________________________________  PROCEDURES Amoxicillin 500 mg PO  Dental Block  Date/Time: 12/01/2018 11:11 PM Performed by: Lissa HoardMenshew, Cybele Maule V Bacon, PA-C Authorized by: Lissa HoardMenshew, Riley Hallum V Bacon, PA-C   Consent:    Consent obtained:  Verbal   Consent given by:  Patient   Risks discussed:  Pain and unsuccessful block   Alternatives discussed:  Alternative treatment Indications:    Indications: dental pain   Location:    Block type:  Supraperiosteal   Supraperiosteal location:  Lower teeth   Lower teeth location:  30/RL 1st molar Procedure details (see MAR for exact dosages):    Syringe type:  Controlled syringe   Needle gauge:  27 G   Anesthetic injected:  Lidocaine 2% WITH epi   Injection procedure:  Anatomic landmarks palpated, anatomic landmarks identified, introduced needle and incremental injection Post-procedure details:    Outcome:  Anesthesia achieved   Patient tolerance of procedure:  Tolerated well, no immediate complications   ____________________________________________  INITIAL IMPRESSION / ASSESSMENT AND PLAN / ED COURSE  [name] was evaluated in Emergency Department on 12/02/2018 for the symptoms described in the history of present illness. She was evaluated in the context of the global COVID-19 pandemic, which necessitated consideration that the patient might be at risk for infection with the SARS-CoV-2 virus that causes COVID-19. Institutional protocols and algorithms that pertain to the evaluation of patients at risk for COVID-19 are in a state of rapid change based on information released by regulatory bodies including the CDC and federal and state organizations. These policies and algorithms were followed during the patient's care in the  ED.  Patient with ED evaluation of dental pain secondary to a chronically broken decayed right lower molar.  Patient is treated with a dental block in the ED.  She is given initial dose of amoxicillin as well.  Prescriptions for amoxicillin and #6 Ultram were provided at this time.  A list of dental providers is given for patient to follow-up for definitive management.  I reviewed the patient's prescription history over the last 12 months in the multi-state controlled substances database(s) that includes [place], [place], [place], [place], [place], [place], [place], [place], [place], [place], [place], [place], and [place].  Results were notable for no history. ____________________________________________  FINAL CLINICAL IMPRESSION(S) / ED DIAGNOSES  Final diagnoses:  Pain due to dental caries      Lissa HoardMenshew, Kaydence Menard V Bacon, PA-C 12/02/18 0054    Minna AntisPaduchowski, Kevin, MD 12/02/18 2319

## 2018-12-01 NOTE — ED Triage Notes (Signed)
Patient with complaint of right lower dental pain that started this morning.

## 2019-02-08 ENCOUNTER — Emergency Department
Admission: EM | Admit: 2019-02-08 | Discharge: 2019-02-08 | Disposition: A | Discharge status: Home / Self Care | Payer: Medicaid Other | Attending: Emergency Medicine | Admitting: Emergency Medicine

## 2019-02-08 ENCOUNTER — Other Ambulatory Visit: Payer: Self-pay

## 2019-02-08 ENCOUNTER — Encounter: Payer: Self-pay | Admitting: Emergency Medicine

## 2019-02-08 DIAGNOSIS — K0889 Other specified disorders of teeth and supporting structures: Secondary | ICD-10-CM | POA: Insufficient documentation

## 2019-02-08 MED ORDER — TRAMADOL HCL 50 MG PO TABS: 50.0000 mg | ORAL_TABLET | Freq: Four times a day (QID) | ORAL | 0 refills | Status: AC | PRN

## 2019-02-08 MED ORDER — IBUPROFEN 600 MG PO TABS
600.0000 mg | ORAL_TABLET | Freq: Once | ORAL | Status: AC
  Administered 2019-02-08: 12:00:00 600 mg via ORAL
  Filled 2019-02-08: qty 1

## 2019-02-08 MED ORDER — AMOXICILLIN 500 MG PO CAPS: 500.0000 mg | ORAL_CAPSULE | Freq: Three times a day (TID) | ORAL | 0 refills | Status: DC

## 2019-02-08 MED ORDER — IBUPROFEN 600 MG PO TABS: 600.0000 mg | ORAL_TABLET | Freq: Three times a day (TID) | ORAL | 0 refills | Status: DC | PRN

## 2019-02-08 MED ORDER — TRAMADOL HCL 50 MG PO TABS
50.0000 mg | ORAL_TABLET | Freq: Once | ORAL | Status: AC
  Administered 2019-02-08: 50 mg via ORAL
  Filled 2019-02-08: qty 1

## 2019-02-08 NOTE — ED Notes (Signed)
PT with noted facial swelling to right side of face.  PT reports right lower tooth abscess.  States pain started yesterday and noted swelling this AM.

## 2019-02-08 NOTE — ED Triage Notes (Signed)
Pt to ED via POV c/o right sided facial swelling. Pt states that she was seen in July due to dental issues on that side of her mouth, pt unable to get into dentist until December. Pt is in NAD.

## 2019-02-08 NOTE — Discharge Instructions (Signed)
May also follow-up for list of dental clinics provided.  OPTIONS FOR DENTAL FOLLOW UP CARE  [place] [facility] and [place] [facility] [phone])  [facility] [phone])  [place] [phone])  [place] [phone])  [facility] [phone]) This clinic caters to the indigent population and is on a lottery system. Location: [employer] [facility], [employer], [place], [place] Clinic Hours: Wednesdays from 6pm - 9pm, patients seen by a lottery system. For dates, call or go to [email or web address] Services: Cleanings, fillings and simple extractions. Payment Options: DENTAL WORK IS FREE OF CHARGE. Bring proof of income or support. Best way to get seen: Arrive at 5:15 pm - this is a lottery, NOT first come/first serve, so arriving earlier will not increase your chances of being seen.     [facility] [phone] Select option 1 for emergencies   Location: [facility], [place], [address] [place] Clinic Hours: No walk-ins accepted - call the day before to schedule an appointment. Check in times are 9:30 am and 1:30 pm. Services: Simple extractions, temporary fillings, pulpectomy/pulp debridement, uncomplicated abscess drainage. Payment Options: PAYMENT IS DUE AT THE TIME OF SERVICE.  Fee is usually $100-200, additional surgical procedures (e.g. abscess drainage) may be extra. Cash, checks, Visa/MasterCard accepted.  Can file Medicaid if patient is covered for dental - patient should call case worker to check. No discount for [facility] patients. Best way to get seen: MUST call the day before and get onto the schedule. Can usually be seen the next 1-2 days. No walk-ins accepted.      [place] [phone]   Location: [place], [place] Clinic Hours: M, W, Th, F 8am or 1:30pm, Tues 9a or 1:30 - first come/first served. Services: Simple extractions, temporary fillings, uncomplicated abscess drainage.  You do not need to be an [facility] resident. Payment Options: PAYMENT IS DUE AT THE TIME OF SERVICE. Dental insurance, otherwise sliding scale - bring proof of income or support. Depending on income and treatment needed, cost is usually $50-200. Best way to get seen: Arrive early as it is first come/first served.     [facility] [phone]   Location: [place] [facility] Hours: Mon-Thu 8a-5p Services: Most basic dental services including extractions and fillings. Payment Options: PAYMENT IS DUE AT THE TIME OF SERVICE. Sliding scale, up to 50% off - bring proof if income or support. Medicaid with dental option accepted. Best way to get seen: Call to schedule an appointment, can usually be seen within 2 weeks OR they will try to see walk-ins - show up at [place] or 2p (you may have to wait).     [facility] [place] RESIDENTS ONLY   Location: [facility], [place] [address] [place], [place] [address] Clinic Hours: By appointment only. Monday - Thursday 8am-5pm, Friday 8am-12pm Services: Cleanings, fillings, extractions. Payment Options: PAYMENT IS DUE AT THE TIME OF SERVICE. Cash, Visa or MasterCard. Sliding scale - $30 minimum per service. Best way to get seen: Come in to office, complete packet and make an appointment - need proof of income or support monies for each household member and proof of [facility] residence. Usually takes about a month to get in.     [facility] [phone]   Location: [address] [facility] Hours: Walk-in Urgent Care  Dental Services are offered Monday-Friday mornings only. The numbers of emergencies accepted daily is limited to the number of providers available. Maximum 15 - Mondays, Wednesdays & Thursdays Maximum 10 - Tuesdays & Fridays Services: You do not need to be a [facility] resident to be seen for a dental emergency. Emergencies are defined as pain, swelling, abnormal bleeding, or dental trauma. Walkins will receive x-rays if needed. NOTE: Dental cleaning is not an emergency. Payment Options: PAYMENT IS DUE AT THE TIME OF SERVICE. Minimum co-pay is $40.00 for uninsured patients. Minimum co-pay is $3.00 for Medicaid with dental coverage. Dental Insurance is accepted and must be presented at time of visit. Medicare does not cover dental. Forms of payment: Cash, credit card, checks. Best way to get seen: If not previously registered with the clinic, walk-in dental registration begins at 7:15 am and is on a first come/first serve basis. If previously registered with the clinic, call to make an appointment.     [facility] [phone] [place] RESIDENTS ONLY   Location: [address], [place], [place] Clinic Hours: Mon-Thu 10a-2p Services: Extractions only! Payment Options: FREE (donations accepted) - bring proof of income or support Best way to get seen: Call and schedule an appointment OR come at 8am on the 1st Monday of every month (except for holidays) when it is first come/first served.     [facility] [phone]   Location: [address] [place], [place] Clinic Hours: Friday mornings Services, Payment Options, Best way to get seen: Call for info

## 2019-02-08 NOTE — ED Provider Notes (Signed)
[facility] Emergency Department Provider Note   ____________________________________________   First MD Initiated Contact with Patient 02/08/19 1115     (approximate)  I have reviewed the triage vital signs and the nursing notes.   HISTORY  Chief Complaint Facial Swelling    HPI Carolyn Davis is a 28 y.o. female patient complain with right-sided facial swelling.   Patient was seen this facility 2 months ago and was advised to follow-up with dental clinic.  Patient states she was told she cannot be seen until December.  Patient rates her pain as a 3/10.  Patient's chronic pain is "achy".  No palliative measure for complaint.      Past Medical History:  Diagnosis Date  . Anemia   . Medical history non-contributory     Patient Active Problem List   Diagnosis Date Noted  . Pain of right breast 09/12/2018  . Discharge from right nipple 09/12/2018  . Previous cesarean delivery affecting pregnancy, antepartum 10/15/2014  . Delivered by cesarean section 10/15/2014  . S/P cesarean section 10/15/2014  . Obesity affecting pregnancy in third trimester 10/07/2014  . Pregnant and not yet delivered 09/30/2014  . NST (non-stress test) reactive 09/23/2014    Past Surgical History:  Procedure Laterality Date  . CESAREAN SECTION    . CESAREAN SECTION N/A 10/15/2014   Procedure: CESAREAN SECTION, Bilateral Tubal Ligation;  Surgeon: Conard NovakStephen D Jackson, MD;  Location: [facility] ORS;  Service: Obstetrics;  Laterality: N/A;  . MYRINGOTOMY      Prior to Admission medications   Medication Sig Start Date End Date Taking? Authorizing Provider  amoxicillin (AMOXIL) 500 MG capsule Take 1 capsule (500 mg total) by mouth 3 (three) times daily. 02/08/19   Joni ReiningSmith,  K, PA-C  ibuprofen (ADVIL) 600 MG tablet Take 1 tablet (600 mg total) by mouth every 8 (eight) hours as needed. 02/08/19   Joni ReiningSmith,  K, PA-C  traMADol (ULTRAM) 50 MG tablet Take 1 tablet (50 mg total) by  mouth every 6 (six) hours as needed for up to 3 days. 02/08/19 02/11/19  Joni ReiningSmith,  K, PA-C    Allergies Sulfa antibiotics  Family History  Problem Relation Age of Onset  . Hypertension Mother   . Diabetes Mother     Social History Social History   Tobacco Use  . Smoking status: Never Smoker  . Smokeless tobacco: Never Used  Substance Use Topics  . Alcohol use: No  . Drug use: No    Review of Systems  Constitutional: No fever/chills Eyes: No visual changes. ENT: No sore throat.  Dental pain. Cardiovascular: Denies chest pain. Respiratory: Denies shortness of breath. Gastrointestinal: No abdominal pain.  No nausea, no vomiting.  No diarrhea.  No constipation. Genitourinary: Negative for dysuria. Musculoskeletal: Negative for back pain. Skin: Negative for rash. Neurological: Negative for headaches, focal weakness or numbness. Allergic/Immunilogical: Sulfa antibiotics. ____________________________________________   PHYSICAL EXAM:  VITAL SIGNS: ED Triage Vitals  Enc Vitals Group     BP 02/08/19 1043 (!) 158/114     Pulse Rate 02/08/19 1043 77     Resp --      Temp 02/08/19 1043 98.2 F (36.8 C)     Temp Source 02/08/19 1043 Oral     SpO2 02/08/19 1043 100 %     Weight 02/08/19 1105 240 lb (108.9 kg)     Height 02/08/19 1105 5\' 2"  (1.575 m)     Head Circumference --      Peak Flow --  Pain Score 02/08/19 1051 3     Pain Loc --      Pain Edu? --      Excl. in [place]? --     Constitutional: Alert and oriented. Well appearing and in no acute distress. Mouth/Throat: Mucous membranes are moist.  Oropharynx non-erythematous.  Fractured tooth #31.  Mild gingival edema. Neck: No stridor.   Hematological/Lymphatic/Immunilogical: No cervical lymphadenopathy. Cardiovascular: Normal rate, regular rhythm. Grossly normal heart sounds.  Good peripheral circulation.  Evaded blood pressure. Respiratory: Normal respiratory effort.  No retractions. Lungs CTAB.  Gastrointestinal: Soft and nontender. No distention. No abdominal bruits. No CVA tenderness. Musculoskeletal: No lower extremity tenderness nor edema.  No joint effusions. Neurologic:  Normal speech and language. No gross focal neurologic deficits are appreciated. No gait instability. Skin:  Skin is warm, dry and intact. No rash noted. Psychiatric: Mood and affect are normal. Speech and behavior are normal.  ____________________________________________   LABS (all labs ordered are listed, but only abnormal results are displayed)  Labs Reviewed - No data to display ____________________________________________  EKG   ____________________________________________  RADIOLOGY  ED MD interpretation:    Official radiology report(s): No results found.  ____________________________________________   PROCEDURES  Procedure(s) performed (including Critical Care):  Procedures   ____________________________________________   INITIAL IMPRESSION / ASSESSMENT AND PLAN / ED COURSE  As part of my medical decision making, I reviewed the following data within the electronic MEDICAL RECORD NUMBER        Carolyn Davis was evaluated in Emergency Department on 02/08/2019 for the symptoms described in the history of present illness. She was evaluated in the context of the global COVID-19 pandemic, which necessitated consideration that the patient might be at risk for infection with the SARS-CoV-2 virus that causes COVID-19. Institutional protocols and algorithms that pertain to the evaluation of patients at risk for COVID-19 are in a state of rapid change based on information released by regulatory bodies including the CDC and federal and state organizations. These policies and algorithms were followed during the patient's care in the ED.  Patient presented for continued dental pain and edema secondary to a fractured right lower molar.  Patient states she is pending dental evaluation in December.   Patient given referral to the [facility] health service.  Patient advised is her responsibility to call to schedule appointment in order to have application approved.  Patient given discharge care instruction advised take medication as directed.      ____________________________________________   FINAL CLINICAL IMPRESSION(S) / ED DIAGNOSES  Final diagnoses:  Pain, dental     ED Discharge Orders         Ordered    amoxicillin (AMOXIL) 500 MG capsule  3 times daily     02/08/19 1135    ibuprofen (ADVIL) 600 MG tablet  Every 8 hours PRN     02/08/19 1135    traMADol (ULTRAM) 50 MG tablet  Every 6 hours PRN     02/08/19 1137           Note:  This document was prepared using Dragon voice recognition software and may include unintentional dictation errors.    Sable Feil, PA-C 02/08/19 1146    Carrie Mew, MD 02/09/19 [id number]

## 2019-12-05 ENCOUNTER — Emergency Department
Admission: EM | Admit: 2019-12-05 | Discharge: 2019-12-05 | Disposition: A | Discharge status: Home / Self Care | Payer: Medicaid Other | Attending: Emergency Medicine | Admitting: Emergency Medicine

## 2019-12-05 ENCOUNTER — Other Ambulatory Visit: Payer: Self-pay

## 2019-12-05 DIAGNOSIS — Z20822 Contact with and (suspected) exposure to covid-19: Secondary | ICD-10-CM | POA: Insufficient documentation

## 2019-12-05 DIAGNOSIS — J069 Acute upper respiratory infection, unspecified: Secondary | ICD-10-CM

## 2019-12-05 DIAGNOSIS — R05 Cough: Secondary | ICD-10-CM | POA: Insufficient documentation

## 2019-12-05 LAB — SARS CORONAVIRUS 2 BY RT PCR (HOSPITAL ORDER, PERFORMED IN ~~LOC~~ HOSPITAL LAB): SARS Coronavirus 2: NEGATIVE

## 2019-12-05 LAB — GROUP A STREP BY PCR: Group A Strep by PCR: NOT DETECTED

## 2019-12-05 MED ORDER — PSEUDOEPH-BROMPHEN-DM 30-2-10 MG/5ML PO SYRP: 2.5000 mL | ORAL_SOLUTION | Freq: Four times a day (QID) | ORAL | 0 refills | Status: DC | PRN

## 2019-12-05 MED ORDER — FLUTICASONE PROPIONATE 50 MCG/ACT NA SUSP: 2.0000 | Freq: Every day | NASAL | 0 refills | Status: DC

## 2019-12-05 NOTE — ED Triage Notes (Signed)
Pt comes via POV from home with c/o cough, congestion and sore throat. Pt states this started yesterday and has gotten worse today.

## 2019-12-05 NOTE — ED Triage Notes (Signed)
FIRST NURSE NOTE:  Pt c/o nasal congestion and itchy throat. No distress noted on arrival, ambulatory without difficulty.

## 2019-12-05 NOTE — ED Notes (Signed)
See triage note  Presents with scratchy throat and nasal congestion   Stats nasal congestion started yesterday "itchy" throat this am  Afebrile on arrival

## 2019-12-05 NOTE — ED Provider Notes (Signed)
[facility] Emergency Department Provider Note  ____________________________________________  Time seen: Approximately 7:33 PM  I have reviewed the triage vital signs and the nursing notes.   HISTORY  Chief Complaint Sore Throat and Cough    HPI NARY SNEED is a 29 y.o. female that presents to the emergency department for evaluation of nasal congestion and scratchy throat since yesterday.  Patient started with a minor intermittent cough earlier today.  No sick contacts.  Patient has not been vaccinated against COVID-19.  No fevers.  No shortness of breath, vomiting, diarrhea.   Past Medical History:  Diagnosis Date  . Anemia   . Medical history non-contributory     Patient Active Problem List   Diagnosis Date Noted  . Pain of right breast 09/12/2018  . Discharge from right nipple 09/12/2018  . Previous cesarean delivery affecting pregnancy, antepartum 10/15/2014  . Delivered by cesarean section 10/15/2014  . S/P cesarean section 10/15/2014  . Obesity affecting pregnancy in third trimester 10/07/2014  . Pregnant and not yet delivered 09/30/2014  . NST (non-stress test) reactive 09/23/2014    Past Surgical History:  Procedure Laterality Date  . CESAREAN SECTION    . CESAREAN SECTION N/A 10/15/2014   Procedure: CESAREAN SECTION, Bilateral Tubal Ligation;  Surgeon: Conard Novak, MD;  Location: [facility] ORS;  Service: Obstetrics;  Laterality: N/A;  . MYRINGOTOMY      Prior to Admission medications   Medication Sig Start Date End Date Taking? Authorizing Provider  brompheniramine-pseudoephedrine-DM 30-2-10 MG/5ML syrup Take 2.5 mLs by mouth 4 (four) times daily as needed. 12/05/19   Enid Derry, PA-C  fluticasone (FLONASE) 50 MCG/ACT nasal spray Place 2 sprays into both nostrils daily. 12/05/19 12/04/20  Enid Derry, PA-C    Allergies Sulfa antibiotics  Family History  Problem Relation Age of Onset  . Hypertension Mother   . Diabetes  Mother     Social History Social History   Tobacco Use  . Smoking status: Never Smoker  . Smokeless tobacco: Never Used  Vaping Use  . Vaping Use: Never used  Substance Use Topics  . Alcohol use: No  . Drug use: No     Review of Systems  Constitutional: No fever/chills ENT: Positive for nasal congestion. Cardiovascular: No chest pain. Respiratory: Positive for cough. No SOB. Gastrointestinal: No abdominal pain.  No nausea, no vomiting.  Musculoskeletal: Negative for musculoskeletal pain. Skin: Negative for rash, abrasions, lacerations, ecchymosis. Neurological: Negative for headaches   ____________________________________________   PHYSICAL EXAM:  VITAL SIGNS: ED Triage Vitals  Enc Vitals Group     BP 12/05/19 1741 129/72     Pulse Rate 12/05/19 1741 80     Resp 12/05/19 1741 20     Temp 12/05/19 1741 97.6 F (36.4 C)     Temp Source 12/05/19 1741 Oral     SpO2 12/05/19 1741 100 %     Weight 12/05/19 1744 255 lb (115.7 kg)     Height 12/05/19 1744 5\' 1"  (1.549 m)     Head Circumference --      Peak Flow --      Pain Score 12/05/19 1748 0     Pain Loc --      Pain Edu? --      Excl. in [place]? --      Constitutional: Alert and oriented. Well appearing and in no acute distress. Eyes: Conjunctivae are normal. PERRL. EOMI. Head: Atraumatic. ENT:      Ears: Tympanic membranes are clear.  Nose: Positive for congestion/rhinnorhea.      Mouth/Throat: Mucous membranes are moist. Oropharynx erythematous. Tonsils not enlarged. Neck: No stridor. Cardiovascular: Normal rate, regular rhythm.  Good peripheral circulation. Respiratory: Normal respiratory effort without tachypnea or retractions. Lungs CTAB. Good air entry to the bases with no decreased or absent breath sounds. Gastrointestinal: Bowel sounds 4 quadrants. Soft and nontender to palpation. No guarding or rigidity. No palpable masses. No distention.  Musculoskeletal: Full range of motion to all  extremities. No gross deformities appreciated. Neurologic:  Normal speech and language. No gross focal neurologic deficits are appreciated.  Skin:  Skin is warm, dry and intact. No rash noted. Psychiatric: Mood and affect are normal. Speech and behavior are normal. Patient exhibits appropriate insight and judgement.   ____________________________________________   LABS (all labs ordered are listed, but only abnormal results are displayed)  Labs Reviewed  GROUP A STREP BY PCR  SARS CORONAVIRUS 2 BY RT PCR (HOSPITAL ORDER, PERFORMED IN [facility])   ____________________________________________  EKG   ____________________________________________  RADIOLOGY   No results found.  ____________________________________________    PROCEDURES  Procedure(s) performed:    Procedures    Medications - No data to display   ____________________________________________   INITIAL IMPRESSION / ASSESSMENT AND PLAN / ED COURSE  Pertinent labs & imaging results that were available during my care of the patient were reviewed by me and considered in my medical decision making (see chart for details).  Review of the [place] CSRS was performed in accordance of the [facility] prior to dispensing any controlled drugs.   Patient's diagnosis is consistent with viral URI. Strep is negative. Covid is negative. Patient will be discharged home with prescriptions for flonase. Patient is to follow up with PCP as directed. Patient is given ED precautions to return to the ED for any worsening or new symptoms.  CHRISTABELLE HANZLIK was evaluated in Emergency Department on 12/05/2019 for the symptoms described in the history of present illness. She was evaluated in the context of the global COVID-19 pandemic, which necessitated consideration that the patient might be at risk for infection with the SARS-CoV-2 virus that causes COVID-19. Institutional protocols and algorithms that pertain to the evaluation  of patients at risk for COVID-19 are in a state of rapid change based on information released by regulatory bodies including the CDC and federal and state organizations. These policies and algorithms were followed during the patient's care in the ED.   ____________________________________________  FINAL CLINICAL IMPRESSION(S) / ED DIAGNOSES  Final diagnoses:  Viral URI with cough      NEW MEDICATIONS STARTED DURING THIS VISIT:  ED Discharge Orders         Ordered    fluticasone (FLONASE) 50 MCG/ACT nasal spray  Daily     Discontinue  Reprint     12/05/19 1940    brompheniramine-pseudoephedrine-DM 30-2-10 MG/5ML syrup  4 times daily PRN     Discontinue  Reprint     12/05/19 1941              This chart was dictated using voice recognition software/Dragon. Despite best efforts to proofread, errors can occur which can change the meaning. Any change was purely unintentional.    Enid Derry, PA-C 12/05/19 2256    Emily Filbert, MD 12/05/19 [phone]

## 2021-02-24 IMAGING — US ULTRASOUND RIGHT BREAST LIMITED
1 series · 2 of 2 positions shown · non-contrast
Comparison: Previous exam(s).

CLINICAL DATA: Pain in the right breast

EXAM:
ULTRASOUND OF THE RIGHT BREAST

[Series 1: ultrasound right breast limited · 0.09mm/px · 2 of 2 slices shown]
[im 1/2]
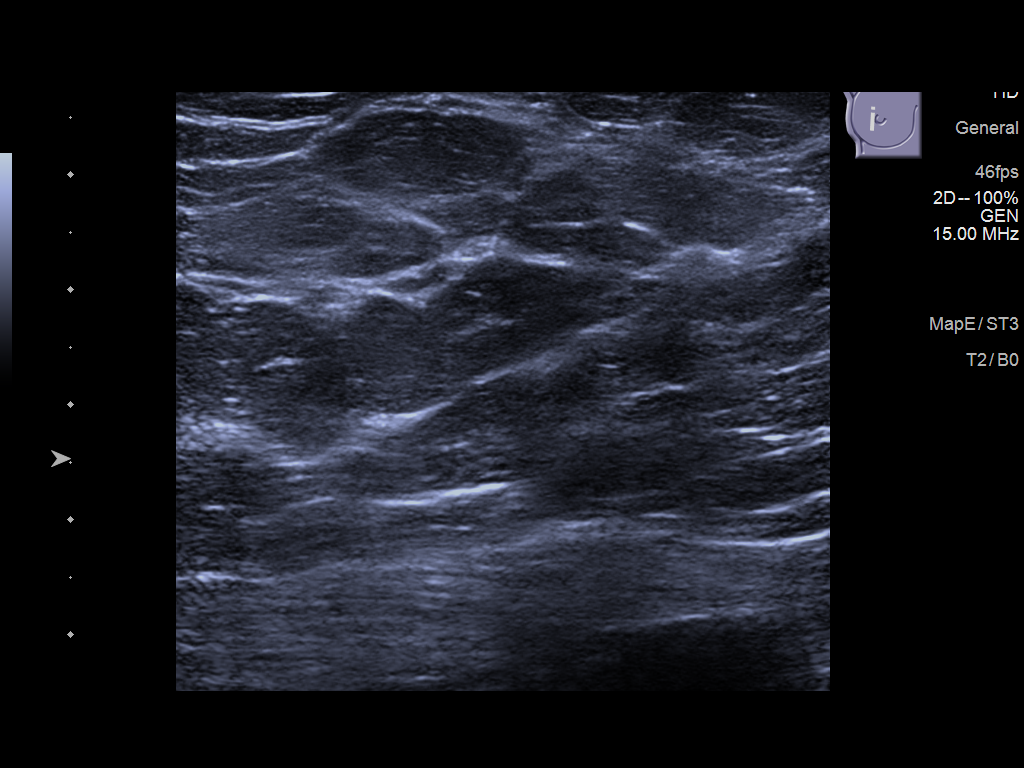
[im 2/2]
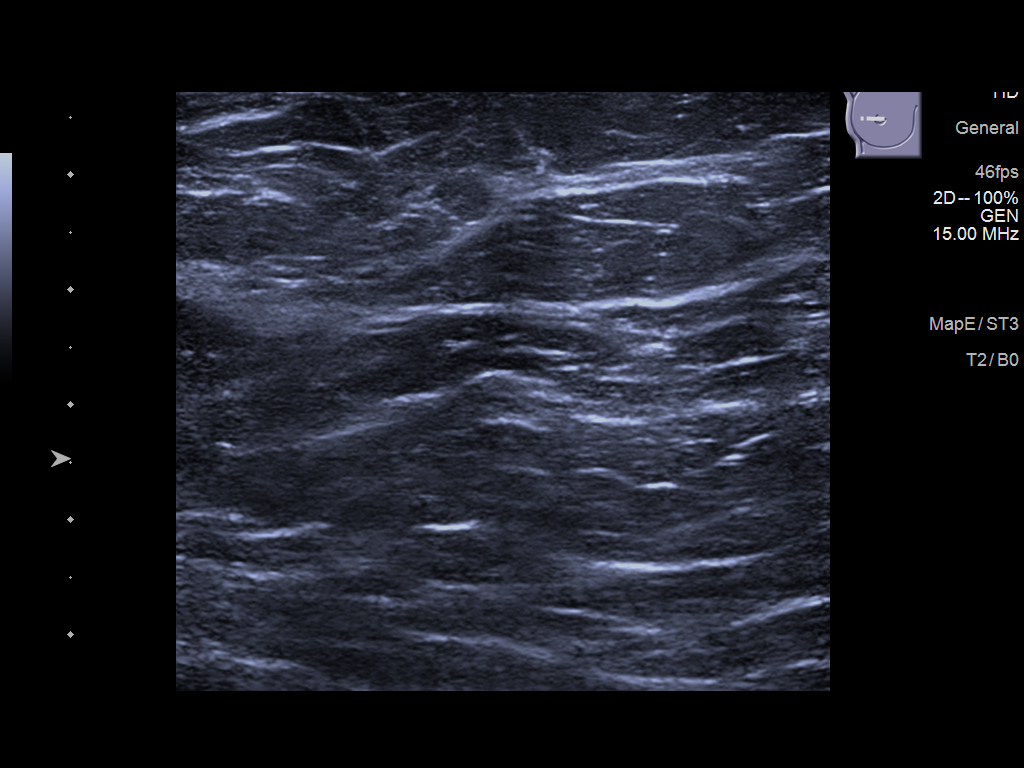

[2 of 2 positions shown; findings below may reference images not displayed]

FINDINGS: On physical exam, no suspicious lumps are identified.

Targeted ultrasound is performed, showing no abnormalities to
correlate with the region of the patient's pain at 9 o'clock, 1 cm
from the nipple.
IMPRESSION: No sonographic evidence of malignancy. No sonographic cause for the
patient's pain identified.

RECOMMENDATION:
Treatment of the patient's symptoms should be based on clinical and
physical exam given lack of imaging findings.

I have discussed the findings and recommendations with the patient.
Results were also provided in writing at the conclusion of the
visit. If applicable, a reminder letter will be sent to the patient
regarding the next appointment.

BI-RADS CATEGORY  1: Negative.

## 2022-05-29 ENCOUNTER — Other Ambulatory Visit: Payer: Self-pay

## 2022-05-29 ENCOUNTER — Emergency Department
Admission: EM | Admit: 2022-05-29 | Discharge: 2022-05-29 | Disposition: A | Discharge status: Home / Self Care | Payer: Medicaid Other | Attending: Emergency Medicine | Admitting: Emergency Medicine

## 2022-05-29 DIAGNOSIS — H6591 Unspecified nonsuppurative otitis media, right ear: Secondary | ICD-10-CM | POA: Insufficient documentation

## 2022-05-29 MED ORDER — AMOXICILLIN 875 MG PO TABS: 875.0000 mg | ORAL_TABLET | Freq: Two times a day (BID) | ORAL | 0 refills | Status: AC

## 2022-05-29 MED ORDER — AMOXICILLIN 500 MG PO CAPS
1000.0000 mg | ORAL_CAPSULE | Freq: Once | ORAL | Status: AC
  Administered 2022-05-29: 1000 mg via ORAL
  Filled 2022-05-29: qty 2

## 2022-05-29 MED ORDER — IBUPROFEN 800 MG PO TABS
800.0000 mg | ORAL_TABLET | Freq: Once | ORAL | Status: AC
  Administered 2022-05-29: 800 mg via ORAL
  Filled 2022-05-29: qty 1

## 2022-05-29 NOTE — ED Provider Notes (Signed)
[facility] Provider Note    Event Date/Time   First MD Initiated Contact with Patient 05/29/22 0150     (approximate)   History   Otalgia   HPI  Carolyn Davis is a 32 y.o. female with history of anemia who presents emergency department for right ear pain that started tonight.  Took Tylenol prior to arrival.  No fever, sore throat, cough.   History provided by patient and significant other.    Past Medical History:  Diagnosis Date   Anemia    Medical history non-contributory     Past Surgical History:  Procedure Laterality Date   CESAREAN SECTION     CESAREAN SECTION N/A 10/15/2014   Procedure: CESAREAN SECTION, Bilateral Tubal Ligation;  Surgeon: Will Bonnet, MD;  Location: [facility] ORS;  Service: Obstetrics;  Laterality: N/A;   MYRINGOTOMY      MEDICATIONS:  Prior to Admission medications   Medication Sig Start Date End Date Taking? Authorizing Provider  brompheniramine-pseudoephedrine-DM 30-2-10 MG/5ML syrup Take 2.5 mLs by mouth 4 (four) times daily as needed. 12/05/19   Laban Emperor, PA-C  fluticasone (FLONASE) 50 MCG/ACT nasal spray Place 2 sprays into both nostrils daily. 12/05/19 12/04/20  Laban Emperor, PA-C    Physical Exam   Triage Vital Signs: ED Triage Vitals  Enc Vitals Group     BP 05/29/22 0129 134/71     Pulse Rate 05/29/22 0129 71     Resp 05/29/22 0129 17     Temp 05/29/22 0129 97.7 F (36.5 C)     Temp Source 05/29/22 0129 Oral     SpO2 05/29/22 0129 99 %     Weight 05/29/22 0130 245 lb (111.1 kg)     Height 05/29/22 0130 5\' 3"  (1.6 m)     Head Circumference --      Peak Flow --      Pain Score 05/29/22 0129 5     Pain Loc --      Pain Edu? --      Excl. in [place]? --     Most recent vital signs: Vitals:   05/29/22 0129  BP: 134/71  Pulse: 71  Resp: 17  Temp: 97.7 F (36.5 C)  SpO2: 99%    CONSTITUTIONAL: Alert and oriented and responds appropriately to questions. Well-appearing;  well-nourished HEAD: Normocephalic, atraumatic EYES: Conjunctivae clear, pupils appear equal, sclera nonicteric ENT: normal nose; moist mucous membranes; No pharyngeal erythema or petechiae, no tonsillar hypertrophy or exudate, no uvular deviation, no unilateral swelling in posterior oropharynx, no trismus or drooling, no muffled voice, normal phonation, no stridor, airway patent.  Left TM is clear without erythema, purulence, bulging, perforation, effusion.  Right TM is erythematous, bulging with clear effusion without drainage.  No cerumen impaction or sign of foreign body in the external auditory canal. No inflammation, erythema or drainage from the external auditory canal. No signs of mastoiditis. No pain with manipulation of the pinna bilaterally. NECK: Supple, normal ROM CARD: RRR; S1 and S2 appreciated; no murmurs, no clicks, no rubs, no gallops RESP: Normal chest excursion without splinting or tachypnea; breath sounds clear and equal bilaterally; no wheezes, no rhonchi, no rales, no hypoxia or respiratory distress, speaking full sentences ABD/GI: Normal bowel sounds; non-distended; soft, non-tender, no rebound, no guarding, no peritoneal signs BACK: The back appears normal EXT: Normal ROM in all joints; no deformity noted, no edema; no cyanosis SKIN: Normal color for age and race; warm; no rash on exposed skin NEURO:  Moves all extremities equally, normal speech PSYCH: The patient's mood and manner are appropriate.   ED Results / Procedures / Treatments   LABS: (all labs ordered are listed, but only abnormal results are displayed) Labs Reviewed - No data to display   EKG:  RADIOLOGY: My personal review and interpretation of imaging:    I have personally reviewed all radiology reports.   No results found.   PROCEDURES:  Critical Care performed: No   Procedures    IMPRESSION / MDM / ASSESSMENT AND PLAN / ED COURSE  I reviewed the triage vital signs and the nursing  notes.    Patient here with right otitis media.     DIFFERENTIAL DIAGNOSIS (includes but not limited to):   Otitis media, no signs of otitis externa, mastoiditis, sepsis   Patient's presentation is most consistent with acute, uncomplicated illness.   PLAN: Patient here with uncomplicated right otitis media.  Will discharge with oxacillin and instructions alternate Tylenol, ibuprofen.  Given PCP follow-up.   MEDICATIONS GIVEN IN ED: Medications  amoxicillin (AMOXIL) capsule 1,000 mg (1,000 mg Oral Given 05/29/22 0224)  ibuprofen (ADVIL) tablet 800 mg (800 mg Oral Given 05/29/22 0224)     ED COURSE:  At this time, I do not feel there is any life-threatening condition present. I reviewed all nursing notes, vitals, pertinent previous records.  All lab and urine results, EKGs, imaging ordered have been independently reviewed and interpreted by myself.  I reviewed all available radiology reports from any imaging ordered this visit.  Based on my assessment, I feel the patient is safe to be discharged home without further emergent workup and can continue workup as an outpatient as needed. Discussed all findings, treatment plan as well as usual and customary return precautions.  They verbalize understanding and are comfortable with this plan.  Outpatient follow-up has been provided as needed.  All questions have been answered.    CONSULTS:  none   OUTSIDE RECORDS REVIEWED: Reviewed last OB/GYN notes in 2016 for C-section.       FINAL CLINICAL IMPRESSION(S) / ED DIAGNOSES   Final diagnoses:  Right otitis media with effusion     Rx / DC Orders   ED Discharge Orders          Ordered    amoxicillin (AMOXIL) 875 MG tablet  2 times daily        05/29/22 0206             Note:  This document was prepared using Dragon voice recognition software and may include unintentional dictation errors.   Remonia Otte, Layla Maw, DO 05/29/22 0225

## 2022-05-29 NOTE — ED Triage Notes (Signed)
Pt presents to ER with c/o right ear pain that started around 1400 yesterday.  Pt states pain has become worse and states she is having trouble hearing from right ear.  Pt endorses recent URI symptoms.  Pt is otherwise A&O x4 at this time in NAD.

## 2022-05-29 NOTE — Discharge Instructions (Addendum)
You may alternate Tylenol 1000 mg every 6 hours as needed for pain, fever and Ibuprofen 800 mg every 6-8 hours as needed for pain, fever.  Please take Ibuprofen with food.  Do not take more than 4000 mg of Tylenol (acetaminophen) in a 24 hour period.   Please go to the following website to schedule new (and existing) patient appointments:   [email or web address]   The following is a list of primary care offices in the area who are accepting new patients at this time.  Please reach out to one of them directly and let them know you would like to schedule an appointment to follow up on an Emergency Department visit, and/or to establish a new primary care provider (PCP).  There are likely other primary care clinics in the are who are accepting new patients, but this is an excellent place to start:  [facility] Lead physician: Dr Angela Bacigalupo [address] [place], [place] [address] [phone]  [facility] Lead Physician: Dr Krichna Sowles [address], [place], [place] [address] [phone]  [facility]  Lead Physician: Dr Megan Johnson [address], [place], [place] [address] [phone]  [facility] [place] [facility] Lead Physician: Dr Alex Karamalegos [address], [place], [place] [address] [phone]  [facility] [place] Lead Physician: Dr Laura Berglund [address], [place],  [address] [phone]  

## 2022-07-15 ENCOUNTER — Ambulatory Visit (INDEPENDENT_AMBULATORY_CARE_PROVIDER_SITE_OTHER): Payer: Self-pay

## 2022-07-15 ENCOUNTER — Ambulatory Visit
Admission: EM | Admit: 2022-07-15 | Discharge: 2022-07-15 | Disposition: A | Discharge status: Home / Self Care | Payer: Self-pay | Attending: Emergency Medicine | Admitting: Emergency Medicine

## 2022-07-15 DIAGNOSIS — R059 Cough, unspecified: Secondary | ICD-10-CM

## 2022-07-15 DIAGNOSIS — U071 COVID-19: Secondary | ICD-10-CM | POA: Insufficient documentation

## 2022-07-15 LAB — SARS CORONAVIRUS 2 BY RT PCR: SARS Coronavirus 2 by RT PCR: POSITIVE — AB

## 2022-07-15 MED ORDER — IPRATROPIUM BROMIDE 0.06 % NA SOLN: 2.0000 | Freq: Four times a day (QID) | NASAL | 12 refills | Status: AC

## 2022-07-15 MED ORDER — PROMETHAZINE-DM 6.25-15 MG/5ML PO SYRP: 5.0000 mL | ORAL_SOLUTION | Freq: Four times a day (QID) | ORAL | 0 refills | Status: DC | PRN

## 2022-07-15 MED ORDER — BENZONATATE 100 MG PO CAPS: 200.0000 mg | ORAL_CAPSULE | Freq: Three times a day (TID) | ORAL | 0 refills | Status: DC

## 2022-07-15 NOTE — ED Triage Notes (Signed)
Pt states that sx started Wednesday. Sneezing,coughing stuffy nose. No fever. Has tried theraflu last night. Nothing today

## 2022-07-15 NOTE — ED Provider Notes (Signed)
[facility]    CSN: [id number] Arrival date & time: 07/15/22  1502      History   Chief Complaint Chief Complaint  Patient presents with   Cough   Nasal Congestion   sneezing    HPI Carolyn Davis is a 32 y.o. female.   HPI  32 year old female here for evaluation of respiratory complaints.  Patient reports that her symptoms began 3 days ago and they consist of nasal congestion, sneezing, productive cough, and shortness of breath.  She denies any fever, nasal discharge, sore throat, or wheezing.  She is unaware of any other sick contacts.  Past Medical History:  Diagnosis Date   Anemia    Medical history non-contributory     Patient Active Problem List   Diagnosis Date Noted   Pain of right breast 09/12/2018   Discharge from right nipple 09/12/2018   Previous cesarean delivery affecting pregnancy, antepartum 10/15/2014   Delivered by cesarean section 10/15/2014   S/P cesarean section 10/15/2014   Obesity affecting pregnancy in third trimester 10/07/2014   Pregnant and not yet delivered 09/30/2014   NST (non-stress test) reactive 09/23/2014    Past Surgical History:  Procedure Laterality Date   CESAREAN SECTION     CESAREAN SECTION N/A 10/15/2014   Procedure: CESAREAN SECTION, Bilateral Tubal Ligation;  Surgeon: Will Bonnet, MD;  Location: [facility] ORS;  Service: Obstetrics;  Laterality: N/A;   MYRINGOTOMY      OB History     Gravida  2   Para  2   Term  2   Preterm  0   AB  0   Living  0      SAB  0   IAB  0   Ectopic  0   Multiple  0   Live Births               Home Medications    Prior to Admission medications   Medication Sig Start Date End Date Taking? Authorizing Provider  benzonatate (TESSALON) 100 MG capsule Take 2 capsules (200 mg total) by mouth every 8 (eight) hours. 07/15/22  Yes Margarette Canada, NP  ipratropium (ATROVENT) 0.06 % nasal spray Place 2 sprays into both nostrils 4 (four) times daily. 07/15/22  Yes  Margarette Canada, NP  promethazine-dextromethorphan (PROMETHAZINE-DM) 6.25-15 MG/5ML syrup Take 5 mLs by mouth 4 (four) times daily as needed. 07/15/22  Yes Margarette Canada, NP    Family History Family History  Problem Relation Age of Onset   Hypertension Mother    Diabetes Mother     Social History Social History   Tobacco Use   Smoking status: Never   Smokeless tobacco: Never  Vaping Use   Vaping Use: Never used  Substance Use Topics   Alcohol use: No   Drug use: No     Allergies   Sulfa antibiotics   Review of Systems Review of Systems  Constitutional:  Negative for fever.  HENT:  Positive for congestion and sneezing. Negative for ear pain, rhinorrhea and sore throat.   Respiratory:  Positive for cough and shortness of breath. Negative for wheezing.      Physical Exam Triage Vital Signs ED Triage Vitals  Enc Vitals Group     BP 07/15/22 1519 129/87     Pulse Rate 07/15/22 1519 78     Resp 07/15/22 1519 18     Temp 07/15/22 1519 98.3 F (36.8 C)     Temp Source 07/15/22 1519 Oral  SpO2 07/15/22 1519 97 %     Weight 07/15/22 1518 275 lb (124.7 kg)     Height --      Head Circumference --      Peak Flow --      Pain Score 07/15/22 1517 0     Pain Loc --      Pain Edu? --      Excl. in [place]? --    No data found.  Updated Vital Signs BP 129/87 (BP Location: Left Arm)   Pulse 78   Temp 98.3 F (36.8 C) (Oral)   Resp 18   Wt 275 lb (124.7 kg)   LMP 06/22/2022 (Exact Date)   SpO2 97%   BMI 48.71 kg/m   Visual Acuity Right Eye Distance:   Left Eye Distance:   Bilateral Distance:    Right Eye Near:   Left Eye Near:    Bilateral Near:     Physical Exam Vitals and nursing note reviewed.  Constitutional:      Appearance: Normal appearance. She is not ill-appearing.  HENT:     Head: Normocephalic and atraumatic.     Right Ear: Tympanic membrane, ear canal and external ear normal. There is no impacted cerumen.     Left Ear: Tympanic membrane, ear  canal and external ear normal. There is no impacted cerumen.     Nose: Congestion and rhinorrhea present.     Comments: Nasal mucosa is erythematous and edematous with clear discharge in both nares.    Mouth/Throat:     Mouth: Mucous membranes are moist.     Pharynx: Oropharynx is clear. Posterior oropharyngeal erythema present. No oropharyngeal exudate.     Comments: Mild erythema in the posterior oropharynx with clear postnasal drip.  No injection or exudate noted. Cardiovascular:     Rate and Rhythm: Normal rate and regular rhythm.     Pulses: Normal pulses.     Heart sounds: Normal heart sounds.  Pulmonary:     Effort: Pulmonary effort is normal.     Breath sounds: Normal breath sounds. No wheezing, rhonchi or rales.  Musculoskeletal:     Cervical back: Normal range of motion and neck supple.  Lymphadenopathy:     Cervical: No cervical adenopathy.  Skin:    General: Skin is warm and dry.     Capillary Refill: Capillary refill takes less than 2 seconds.     Findings: No erythema or rash.  Neurological:     General: No focal deficit present.     Mental Status: She is alert and oriented to person, place, and time.      UC Treatments / Results  Labs (all labs ordered are listed, but only abnormal results are displayed) Labs Reviewed  SARS CORONAVIRUS 2 BY RT PCR - Abnormal; Notable for the following components:      Result Value   SARS Coronavirus 2 by RT PCR POSITIVE (*)    All other components within normal limits    EKG   Radiology DG Chest 2 View  Result Date: 07/15/2022 CLINICAL DATA:  Productive cough for 3 days. EXAM: CHEST - 2 VIEW COMPARISON:  08/20/2009 FINDINGS: The heart size and mediastinal contours are within normal limits. Both lungs are clear. The visualized skeletal structures are unremarkable. IMPRESSION: Normal exam. Electronically Signed   By: Marlaine Hind M.D.   On: 07/15/2022 15:59    Procedures Procedures (including critical care  time)  Medications Ordered in UC Medications - No data to display  Initial Impression / Assessment and Plan / UC Course  I have reviewed the triage vital signs and the nursing notes.  Pertinent labs & imaging results that were available during my care of the patient were reviewed by me and considered in my medical decision making (see chart for details).   Patient is a pleasant, nontoxic-appearing 32 year old female here for evaluation of 3 days with respiratory symptoms as outlined HPI above.  On exam she is inflamed nasal mucosa with clear rhinorrhea.  There is also erythema to the posterior pharynx with clear postnasal drip.  No cervical lymphadenopathy on exam and her cardiopulmonary exam reveals clear lung sounds in all fields.  Given the fact that she has been having a productive cough I will obtain a chest x-ray to look for any acute cardiopulmonary pathology.  I will also order a COVID PCR given her respiratory symptoms.  I am not can order a flu at this time as she has had symptoms for 3 days and she is outside the therapeutic window for Tamiflu intervention.  Radiology impression of chest x-ray states no active cardiopulmonary disease.  COVID PCR is positive.  I will discharge patient home with a diagnosis of COVID-19 and have her quarantine for 5 days from onset of her symptoms.  After 5 days she can break quarantine if her symptoms have improved and she has not had a fever for 24 hours without taking Tylenol and/or ibuprofen.  I will prescribe Atrovent nasal spray to help with the nasal congestion and Tessalon Perles and Promethazine DM cough syrup to help with cough and congestion.  ER precautions reviewed.   Final Clinical Impressions(s) / UC Diagnoses   Final diagnoses:  [id number]     Discharge Instructions      You have tested positive for COVID-19 today.  You will need to quarantine for 5 days from onset of your symptoms.  After 5 days you can break quarantine if your  symptoms have improved and you have not had a fever for 24 hours without taking Tylenol and/or ibuprofen.  You must wear a mask around other people for an additional 5 days.  Take over-the-counter Tylenol and/or ibuprofen according to the package instructions as needed for fever and pain.  Use the Atrovent nasal spray, 2 squirts in each nostril every 6 hours, as needed for runny nose and postnasal drip.  Use the Tessalon Perles every 8 hours during the day.  Take them with a small sip of water.  They may give you some numbness to the base of your tongue or a metallic taste in your mouth, this is normal.  Use the Promethazine DM cough syrup at bedtime for cough and congestion.  It will make you drowsy so do not take it during the day.  If you develop any worsening shortness of breath, especially at rest, feel as though you cannot catch your breath, feel as though you cannot speak in full sentences, or your lips begin turning blue you need to call 911 and go to the ER.     ED Prescriptions     Medication Sig Dispense Auth. Provider   benzonatate (TESSALON) 100 MG capsule Take 2 capsules (200 mg total) by mouth every 8 (eight) hours. 21 capsule Margarette Canada, NP   ipratropium (ATROVENT) 0.06 % nasal spray Place 2 sprays into both nostrils 4 (four) times daily. 15 mL Margarette Canada, NP   promethazine-dextromethorphan (PROMETHAZINE-DM) 6.25-15 MG/5ML syrup Take 5 mLs by mouth 4 (four) times daily as  needed. 118 mL Margarette Canada, NP      PDMP not reviewed this encounter.   Margarette Canada, NP 07/15/22 [phone]

## 2022-07-15 NOTE — Discharge Instructions (Signed)
You have tested positive for COVID-19 today.  You will need to quarantine for 5 days from onset of your symptoms.  After 5 days you can break quarantine if your symptoms have improved and you have not had a fever for 24 hours without taking Tylenol and/or ibuprofen.  You must wear a mask around other people for an additional 5 days.  Take over-the-counter Tylenol and/or ibuprofen according to the package instructions as needed for fever and pain.  Use the Atrovent nasal spray, 2 squirts in each nostril every 6 hours, as needed for runny nose and postnasal drip.  Use the Tessalon Perles every 8 hours during the day.  Take them with a small sip of water.  They may give you some numbness to the base of your tongue or a metallic taste in your mouth, this is normal.  Use the Promethazine DM cough syrup at bedtime for cough and congestion.  It will make you drowsy so do not take it during the day.  If you develop any worsening shortness of breath, especially at rest, feel as though you cannot catch your breath, feel as though you cannot speak in full sentences, or your lips begin turning blue you need to call 911 and go to the ER.

## 2022-09-28 ENCOUNTER — Other Ambulatory Visit: Payer: Self-pay

## 2022-09-28 ENCOUNTER — Encounter: Payer: Self-pay | Admitting: Intensive Care

## 2022-09-28 ENCOUNTER — Emergency Department
Admission: EM | Admit: 2022-09-28 | Discharge: 2022-09-28 | Disposition: A | Discharge status: Home / Self Care | Payer: Self-pay | Attending: Emergency Medicine | Admitting: Emergency Medicine

## 2022-09-28 ENCOUNTER — Emergency Department: Payer: Self-pay

## 2022-09-28 DIAGNOSIS — M549 Dorsalgia, unspecified: Secondary | ICD-10-CM

## 2022-09-28 DIAGNOSIS — M25512 Pain in left shoulder: Secondary | ICD-10-CM | POA: Insufficient documentation

## 2022-09-28 DIAGNOSIS — M546 Pain in thoracic spine: Secondary | ICD-10-CM | POA: Insufficient documentation

## 2022-09-28 MED ORDER — ACETAMINOPHEN 325 MG PO TABS
650.0000 mg | ORAL_TABLET | Freq: Once | ORAL | Status: AC
  Administered 2022-09-28: 650 mg via ORAL
  Filled 2022-09-28: qty 2

## 2022-09-28 MED ORDER — IBUPROFEN 600 MG PO TABS
600.0000 mg | ORAL_TABLET | Freq: Once | ORAL | Status: AC
  Administered 2022-09-28: 600 mg via ORAL
  Filled 2022-09-28: qty 1

## 2022-09-28 MED ORDER — CYCLOBENZAPRINE HCL 5 MG PO TABS: 5.0000 mg | ORAL_TABLET | Freq: Three times a day (TID) | ORAL | 0 refills | Status: AC | PRN

## 2022-09-28 NOTE — ED Notes (Signed)
See triage note  Presents with pain to left shoulder/back area  States pain started without injury

## 2022-09-28 NOTE — ED Provider Notes (Signed)
[facility] Provider Note    Event Date/Time   First MD Initiated Contact with Patient 09/28/22 1108     (approximate)   History   Back Pain and Arm Pain   HPI  Carolyn Davis is a 32 y.o. female presenting to the emergency department for evaluation of left shoulder and back pain.  Reports that she has had intermittent left shoulder pain for a while, usually comes, improves within a day.  A few days ago she also noticed some pain over her left mid back.  No identifiable trauma, heavy lifting, injury.  She has tried massaging the area and topical treatments with improvement, but not resolution.  Presents given ongoing symptoms.  No fevers, chest pain, shortness of breath.     Physical Exam   Triage Vital Signs: ED Triage Vitals  Enc Vitals Group     BP 09/28/22 1057 (!) 147/87     Pulse Rate 09/28/22 1057 60     Resp 09/28/22 1057 16     Temp 09/28/22 1057 98.4 F (36.9 C)     Temp Source 09/28/22 1057 Oral     SpO2 09/28/22 1057 98 %     Weight 09/28/22 1058 245 lb (111.1 kg)     Height 09/28/22 1058 5\' 2"  (1.575 m)     Head Circumference --      Peak Flow --      Pain Score 09/28/22 1058 4     Pain Loc --      Pain Edu? --      Excl. in [place]? --     Most recent vital signs: Vitals:   09/28/22 1057  BP: (!) 147/87  Pulse: 60  Resp: 16  Temp: 98.4 F (36.9 C)  SpO2: 98%     General: Awake, interactive  CV:  Regular rate, good peripheral perfusion.  Resp:  Lungs clear, unlabored respirations.  Abd:  Soft, nondistended.  Neuro:  Symmetric facial movement, fluid speech MSK:   Tenderness to palpation along the left shoulder joint.  No point tenderness over the clavicle or scapula.  Patient is able to passively and actively range her shoulder, but does report pain with attempts at range of motion.  There is some tenderness over the left mid back, no midline tenderness.  No overlying skin changes.  Pain is readily reproducible with palpation  and range of motion.   ED Results / Procedures / Treatments   Labs (all labs ordered are listed, but only abnormal results are displayed) Labs Reviewed - No data to display  RADIOLOGY Imaging independently reviewed and interpreted by myself demonstrates:  Left shoulder x-Rebeca Valdivia without acute fracture or dislocation, degenerative changes noted by radiology Chest and rib x-Teana Lindahl without evidence of acute rib fractures  PROCEDURES:  Critical Care performed: No  Procedures   MEDICATIONS ORDERED IN ED: Medications  acetaminophen (TYLENOL) tablet 650 mg (650 mg Oral Given 09/28/22 1202)  ibuprofen (ADVIL) tablet 600 mg (600 mg Oral Given 09/28/22 1202)     IMPRESSION / MDM / ASSESSMENT AND PLAN / ED COURSE  I reviewed the triage vital signs and the nursing notes.  Differential diagnosis includes, but is not limited to, muscle strain, ligamentous injury, fracture, dislocation  Patient's presentation is most consistent with acute illness / injury with system symptoms.  32 year old female presenting with reproducible pain over her left shoulder and mid back without significant recent trauma.  Suspect likely musculoskeletal strain.  Will treat symptomatically with Tylenol and  ibuprofen, obtain x-rays to rule out bony injury.  Patient feeling better on reevaluation.  X-rays without acute bony injury, some degenerative changes noted at the [facility] joint, suspect that this is the cause of her intermittent shoulder pain.  Suspect likely muscle strain in her back.  Patient comfortable discharge home.  Will DC with short course of muscle relaxer.  Strict return precautions provided.      FINAL CLINICAL IMPRESSION(S) / ED DIAGNOSES   Final diagnoses:  Upper back pain on left side  Acute pain of left shoulder     Rx / DC Orders   ED Discharge Orders          Ordered    cyclobenzaprine (FLEXERIL) 5 MG tablet  3 times daily PRN        09/28/22 1255             Note:  This document was  prepared using Dragon voice recognition software and may include unintentional dictation errors.   Trinna Post, MD 09/28/22 [phone]

## 2022-09-28 NOTE — ED Triage Notes (Signed)
Patient c/o lower back pain and left arm pain. Reports when she lifts her left arm, her left shoulder hurts. Denies injury. Reports pain was manageable until yesterday

## 2022-09-28 NOTE — Discharge Instructions (Addendum)
You were seen in the emergency room today for evaluation of your back and shoulder pain.  Your x-rays did show some degenerative changes in your shoulder that may be contributing to your pain.  You can take Tylenol and Profen as needed if there is not another reason you should not take these medicines.  I have also sent a short course of a muscle relaxer to your pharmacy that you can take as needed.  This can make you drowsy, do not drive or operate machinery when taking this.  They were otherwise reassuring.  If you are having ongoing symptoms, you can follow-up with orthopedics for further evaluation.  Return to the ER for new or worsening symptoms.
